# Patient Record
Sex: Female | Born: 1980 | Race: White | Hispanic: No | Marital: Married | Smoking: Never smoker
Health system: Southern US, Community
[De-identification: ages and names within clinical notes are randomized; demographics above are authoritative.]

## PROBLEM LIST (undated history)

## (undated) DIAGNOSIS — F429 Obsessive-compulsive disorder, unspecified: Secondary | ICD-10-CM

## (undated) DIAGNOSIS — F419 Anxiety disorder, unspecified: Secondary | ICD-10-CM

## (undated) DIAGNOSIS — K219 Gastro-esophageal reflux disease without esophagitis: Secondary | ICD-10-CM

## (undated) HISTORY — DX: Obsessive-compulsive disorder, unspecified: F42.9

## (undated) HISTORY — PX: WISDOM TOOTH EXTRACTION: SHX21

---

## 2003-06-25 ENCOUNTER — Encounter: Payer: Self-pay | Admitting: Family Medicine

## 2003-06-25 ENCOUNTER — Encounter: Admission: RE | Admit: 2003-06-25 | Discharge: 2003-06-25 | Payer: Self-pay | Admitting: Family Medicine

## 2012-06-09 ENCOUNTER — Other Ambulatory Visit: Payer: Self-pay | Admitting: Gastroenterology

## 2012-06-09 DIAGNOSIS — R109 Unspecified abdominal pain: Secondary | ICD-10-CM

## 2012-06-16 ENCOUNTER — Other Ambulatory Visit (HOSPITAL_COMMUNITY): Payer: Self-pay

## 2013-08-28 ENCOUNTER — Ambulatory Visit (INDEPENDENT_AMBULATORY_CARE_PROVIDER_SITE_OTHER): Payer: BC Managed Care – PPO | Admitting: Licensed Clinical Social Worker

## 2013-08-28 DIAGNOSIS — F411 Generalized anxiety disorder: Secondary | ICD-10-CM

## 2013-09-20 ENCOUNTER — Ambulatory Visit (INDEPENDENT_AMBULATORY_CARE_PROVIDER_SITE_OTHER): Payer: BC Managed Care – PPO | Admitting: Licensed Clinical Social Worker

## 2013-09-20 DIAGNOSIS — F411 Generalized anxiety disorder: Secondary | ICD-10-CM

## 2013-10-04 ENCOUNTER — Ambulatory Visit (INDEPENDENT_AMBULATORY_CARE_PROVIDER_SITE_OTHER): Payer: BC Managed Care – PPO | Admitting: Licensed Clinical Social Worker

## 2013-10-04 DIAGNOSIS — F411 Generalized anxiety disorder: Secondary | ICD-10-CM

## 2013-10-23 ENCOUNTER — Ambulatory Visit (INDEPENDENT_AMBULATORY_CARE_PROVIDER_SITE_OTHER): Payer: BC Managed Care – PPO | Admitting: Licensed Clinical Social Worker

## 2013-10-23 DIAGNOSIS — F411 Generalized anxiety disorder: Secondary | ICD-10-CM

## 2013-11-13 ENCOUNTER — Ambulatory Visit (INDEPENDENT_AMBULATORY_CARE_PROVIDER_SITE_OTHER): Payer: BC Managed Care – PPO | Admitting: Licensed Clinical Social Worker

## 2013-11-13 DIAGNOSIS — F411 Generalized anxiety disorder: Secondary | ICD-10-CM

## 2013-12-11 ENCOUNTER — Ambulatory Visit (INDEPENDENT_AMBULATORY_CARE_PROVIDER_SITE_OTHER): Payer: BC Managed Care – PPO | Admitting: Licensed Clinical Social Worker

## 2013-12-11 DIAGNOSIS — F411 Generalized anxiety disorder: Secondary | ICD-10-CM

## 2014-01-08 ENCOUNTER — Ambulatory Visit (INDEPENDENT_AMBULATORY_CARE_PROVIDER_SITE_OTHER): Payer: BC Managed Care – PPO | Admitting: Licensed Clinical Social Worker

## 2014-01-08 DIAGNOSIS — F411 Generalized anxiety disorder: Secondary | ICD-10-CM

## 2015-03-29 ENCOUNTER — Other Ambulatory Visit: Payer: Self-pay | Admitting: Occupational Medicine

## 2015-03-29 ENCOUNTER — Ambulatory Visit: Payer: Self-pay

## 2015-03-29 DIAGNOSIS — M79604 Pain in right leg: Secondary | ICD-10-CM

## 2016-02-13 ENCOUNTER — Ambulatory Visit (INDEPENDENT_AMBULATORY_CARE_PROVIDER_SITE_OTHER): Payer: BC Managed Care – PPO | Admitting: Podiatry

## 2016-02-13 ENCOUNTER — Encounter: Payer: Self-pay | Admitting: Podiatry

## 2016-02-13 ENCOUNTER — Ambulatory Visit (INDEPENDENT_AMBULATORY_CARE_PROVIDER_SITE_OTHER): Payer: BC Managed Care – PPO

## 2016-02-13 VITALS — BP 119/77 | HR 80 | Resp 16 | Ht 64.0 in | Wt 173.0 lb

## 2016-02-13 DIAGNOSIS — M779 Enthesopathy, unspecified: Secondary | ICD-10-CM

## 2016-02-13 DIAGNOSIS — M79672 Pain in left foot: Secondary | ICD-10-CM | POA: Diagnosis not present

## 2016-02-13 DIAGNOSIS — M79671 Pain in right foot: Secondary | ICD-10-CM

## 2016-02-13 NOTE — Progress Notes (Signed)
   Subjective:    Patient ID: Sandra Calhoun, female    DOB: Feb 12, 1981, 35 y.o.   MRN: [id number]  HPI Patient presents with bilateral foot pain; plantar forefoot. Pt stated, "when run, feet hurt; burning pain; more pain in right foot"; x2-3 yr.   Review of Systems  HENT: Positive for sinus pressure.   Neurological: Positive for dizziness.  All other systems reviewed and are negative.      Objective:   Physical Exam        Assessment & Plan:

## 2016-02-16 NOTE — Progress Notes (Signed)
Subjective:     Patient ID: Sandra Calhoun, female   DOB: 10-Jan-1981, 35 y.o.   MRN: [id number]  HPI patient states when she tries to be active or running that she gets pain in the forefoot right over left and it's been going on for several years. She's tried to modify her shoe gear she's tried trimming some callus formation and is not achieved relief of symptoms   Review of Systems  All other systems reviewed and are negative.      Objective:   Physical Exam  Constitutional: She is oriented to person, place, and time.  Cardiovascular: Intact distal pulses.   Musculoskeletal: Normal range of motion.  Neurological: She is oriented to person, place, and time.  Skin: Skin is warm.  Nursing note and vitals reviewed.  neurovascular status intact muscle strength adequate range of motion within normal limits with patient found to have discomfort in the forefoot right over left with inflammation and fluid around the metatarsal phalangeal joint second bilateral. There is mild equinus condition which is probably contributory to her problem and callus formation which does create increased pressure     Assessment:     Foot structural issues leading to chronic increase and inflammation pain around the lesser MPJs right over left    Plan:     H&P and x-rays reviewed with patient. At this point I have recommended long-term orthotics to disperse pressure off the joint surfaces and physical therapy and orthotics were scanned for today. We also discussed shoe gear modifications was stiffer bottom shoes  X-ray report indicates that there is no indications of stress fracture or other pathological process

## 2016-03-11 ENCOUNTER — Ambulatory Visit: Payer: BC Managed Care – PPO | Admitting: *Deleted

## 2016-03-11 DIAGNOSIS — M79672 Pain in left foot: Principal | ICD-10-CM

## 2016-03-11 DIAGNOSIS — M79671 Pain in right foot: Secondary | ICD-10-CM

## 2016-03-11 NOTE — Progress Notes (Signed)
Patient ID: Sandra Calhoun, female   DOB: [birth year], 35 y.o.   MRN: [id number] Patient presents for orthotic pick up.  Verbal and written break in and wear instructions given.  Patient will follow up in 4 weeks if symptoms worsen or fail to improve.

## 2016-03-11 NOTE — Patient Instructions (Signed)

## 2017-08-05 ENCOUNTER — Encounter (HOSPITAL_COMMUNITY): Payer: Self-pay | Admitting: Emergency Medicine

## 2017-08-05 ENCOUNTER — Emergency Department (HOSPITAL_COMMUNITY)
Admission: EM | Admit: 2017-08-05 | Discharge: 2017-08-06 | Disposition: A | Discharge status: Home / Self Care | Payer: BC Managed Care – PPO | Attending: Emergency Medicine | Admitting: Emergency Medicine

## 2017-08-05 DIAGNOSIS — R450 Nervousness: Secondary | ICD-10-CM | POA: Diagnosis not present

## 2017-08-05 DIAGNOSIS — Z79899 Other long term (current) drug therapy: Secondary | ICD-10-CM | POA: Diagnosis not present

## 2017-08-05 DIAGNOSIS — F411 Generalized anxiety disorder: Secondary | ICD-10-CM | POA: Diagnosis not present

## 2017-08-05 DIAGNOSIS — F429 Obsessive-compulsive disorder, unspecified: Secondary | ICD-10-CM | POA: Diagnosis present

## 2017-08-05 DIAGNOSIS — F419 Anxiety disorder, unspecified: Secondary | ICD-10-CM | POA: Diagnosis present

## 2017-08-05 HISTORY — DX: Anxiety disorder, unspecified: F41.9

## 2017-08-05 HISTORY — DX: Gastro-esophageal reflux disease without esophagitis: K21.9

## 2017-08-05 LAB — COMPREHENSIVE METABOLIC PANEL
ALBUMIN: 4.1 g/dL (ref 3.5–5.0)
ALT: 33 U/L (ref 14–54)
ANION GAP: 11 (ref 5–15)
AST: 24 U/L (ref 15–41)
Alkaline Phosphatase: 30 U/L — ABNORMAL LOW (ref 38–126)
BILIRUBIN TOTAL: 0.6 mg/dL (ref 0.3–1.2)
BUN: 13 mg/dL (ref 6–20)
CO2: 23 mmol/L (ref 22–32)
CREATININE: 0.7 mg/dL (ref 0.44–1.00)
Calcium: 9.1 mg/dL (ref 8.9–10.3)
Chloride: 103 mmol/L (ref 101–111)
GFR calc non Af Amer: >60 mL/min (ref >60)
GLUCOSE: 144 mg/dL — AB (ref 65–99)
Potassium: 3.7 mmol/L (ref 3.5–5.1)
SODIUM: 137 mmol/L (ref 135–145)
TOTAL PROTEIN: 7.4 g/dL (ref 6.5–8.1)

## 2017-08-05 LAB — RAPID URINE DRUG SCREEN, HOSP PERFORMED
Amphetamines: NOT DETECTED
Barbiturates: NOT DETECTED
Benzodiazepines: NOT DETECTED
Cocaine: NOT DETECTED
Opiates: NOT DETECTED
TETRAHYDROCANNABINOL: NOT DETECTED

## 2017-08-05 LAB — CBC
HEMATOCRIT: 39.6 % (ref 36.0–46.0)
HEMOGLOBIN: 13.8 g/dL (ref 12.0–15.0)
MCH: 31 pg (ref 26.0–34.0)
MCHC: 34.8 g/dL (ref 30.0–36.0)
MCV: 89 fL (ref 78.0–100.0)
Platelets: 240 10*3/uL (ref 150–400)
RBC: 4.45 MIL/uL (ref 3.87–5.11)
RDW: 11.7 % (ref 11.5–15.5)
WBC: 10 10*3/uL (ref 4.0–10.5)

## 2017-08-05 LAB — ACETAMINOPHEN LEVEL

## 2017-08-05 LAB — SALICYLATE LEVEL: Salicylate Lvl: <7 mg/dL (ref 2.8–30.0)

## 2017-08-05 LAB — HCG, QUANTITATIVE, PREGNANCY: hCG, Beta Chain, Quant, S: <1 m[IU]/mL (ref <5)

## 2017-08-05 LAB — TSH: TSH: 2.095 u[IU]/mL (ref 0.350–4.500)

## 2017-08-05 LAB — POC URINE PREG, ED: PREG TEST UR: NEGATIVE

## 2017-08-05 LAB — ETHANOL: Alcohol, Ethyl (B): <10 mg/dL (ref <10)

## 2017-08-05 MED ORDER — LORAZEPAM 1 MG PO TABS
1.0000 mg | ORAL_TABLET | Freq: Once | ORAL | Status: AC
  Administered 2017-08-05: 1 mg via ORAL
  Filled 2017-08-05: qty 1

## 2017-08-05 MED ORDER — LEVONORG-ETH ESTRAD TRIPHASIC PO TABS: 1.0000 | ORAL_TABLET | Freq: Every day | ORAL | Status: DC

## 2017-08-05 MED ORDER — DULOXETINE HCL 30 MG PO CPEP: 30.0000 mg | ORAL_CAPSULE | Freq: Every day | ORAL | Status: DC

## 2017-08-05 MED ORDER — CLONAZEPAM 0.5 MG PO TABS
0.5000 mg | ORAL_TABLET | Freq: Two times a day (BID) | ORAL | Status: DC | PRN
  Administered 2017-08-05: 0.5 mg via ORAL
  Filled 2017-08-05: qty 1

## 2017-08-05 MED ORDER — ASENAPINE MALEATE 5 MG SL SUBL
5.0000 mg | SUBLINGUAL_TABLET | Freq: Every day | SUBLINGUAL | Status: DC
  Administered 2017-08-05: 5 mg via SUBLINGUAL
  Filled 2017-08-05: qty 1

## 2017-08-05 MED ORDER — PANTOPRAZOLE SODIUM 40 MG PO TBEC
40.0000 mg | DELAYED_RELEASE_TABLET | Freq: Every day | ORAL | Status: DC
  Administered 2017-08-06: 40 mg via ORAL
  Filled 2017-08-05: qty 1

## 2017-08-05 NOTE — ED Notes (Signed)
Pt came to [facility] reporting decrease sleep and appetite. Pt says that she obsesses and has racing thoughts. Pt said that she was taking seroquel, had a panic attack and thought that it was not working well. Pt's doctor changed her medication to cymbalta. She saw a commercial about the medication and thought that she was having the symptoms. Pt reports passive si thoughts "I don't think that I would ever do it." When asked about HI thoughts . She responded "I worry, could I do it?"

## 2017-08-05 NOTE — ED Notes (Signed)
Bed: [facility] Expected date:  Expected time:  Means of arrival:  Comments: 28 

## 2017-08-05 NOTE — ED Notes (Signed)
Vernona Rieger from main lab verbalizes will add hcg to labs already collected/in lab.

## 2017-08-05 NOTE — BHH Counselor (Signed)
Collateral:   Karleen Hampshire, PA, with [facility] called TTS reporting she referring the patient to be assessed. Report patient medication was changed Tuesday. Report since medication change patient has not been eating, sleeping, and having suicidal thoughts with a plan to cut self.

## 2017-08-05 NOTE — ED Notes (Signed)
Pt pleasant on approach. Voicing no complaints at this time. Pt not endorsing SI. Encouragement and support provided. Special checks q 15 mins in place for safety, Video monitoring in place. Will continue to monitor.

## 2017-08-05 NOTE — ED Notes (Signed)
Visitor at bedside.

## 2017-08-05 NOTE — BH Assessment (Signed)
Assessment Note  Sandra Calhoun is an 36 y.o. female presenting to [facility] voluntarily for a psychiatric evaluation. She was transported to [facility] by her mother. Referred by Karleen Hampshire, PA.  Patient presents with passive suicidal ideations starting last night. She does not have a history of suicidal thoughts, gestures, and/or attempts. Denies self mutilating behaviors. She believes that her suicidal ideations are the result of a recent medication change. She was taking Seroquel and her doctor changed her medication to Cymbalta 2-3 days ago. Since the medication change patient has no only started having suicidal thoughts but also reports a increase in anxiety. She is married and her spouse is out of town. She has decided to stay with her mother for the past 2 days for comfort. She also reports lack of sleep and appetite for 2 days. She reports 7 pounds of weight loss in past several days. She denies HI. Calm and cooperative. No AVH's. Patient does no appear to be responding to internal stimuli. Denies drug use. She drinks a glass of wine occasionally. She has no history of Inpatient psychiatric treatment.   Diagnosis: Major Depressive Disorder, Single Episode, Severe, without psychotic features  Past Medical History:  Past Medical History:  Diagnosis Date  . Anxiety   . GERD (gastroesophageal reflux disease)     Past Surgical History:  Procedure Laterality Date  . WISDOM TOOTH EXTRACTION      Family History:  Family History  Problem Relation Age of Onset  . Cancer Mother   . Diabetes Father   . Hypertension Father   . Hyperlipidemia Father     Social History:  reports that she has never smoked. She does not have any smokeless tobacco history on file. She reports that she drinks alcohol. She reports that she does not use drugs.  Additional Social History:  Alcohol / Drug Use Pain Medications: SEE MAR Prescriptions: SEE MAR Over the Counter: SEE MAR History of alcohol / drug use?: No history of  alcohol / drug abuse  CIWA: CIWA-Ar BP: 134/77 Pulse Rate: 79 COWS:    Allergies: No Known Allergies  Home Medications:  (Not in a hospital admission)  OB/GYN Status:  Patient's last menstrual period was 07/23/2017 (exact date).  General Assessment Data Location of Assessment: [facility] ED TTS Assessment: In system Is this a Tele or Face-to-Face Assessment?: Face-to-Face Is this an Initial Assessment or a Re-assessment for this encounter?: Initial Assessment Marital status: Married [place] name:  (unk) Is patient pregnant?: No Pregnancy Status: No Living Arrangements: Spouse/significant other Can pt return to current living arrangement?: No Admission Status: Voluntary Is patient capable of signing voluntary admission?: Yes Referral Source: Self/Family/Friend Insurance type:  Herbalist)     Crisis Care Plan Living Arrangements: Spouse/significant other Legal Guardian: Other: (no legal guardian ) Name of Psychiatrist:  (no psychiatrist ) Name of Therapist:  (no therapist )  Education Status Is patient currently in school?: No Current Grade:  (n/a) Highest grade of school patient has completed:  (Masters Degree) Name of school:  (n/a) Contact person:  (n/a)  Risk to self with the past 6 months Suicidal Ideation: Yes-Currently Present Has patient been a risk to self within the past 6 months prior to admission? : Yes Suicidal Intent: No Has patient had any suicidal intent within the past 6 months prior to admission? : No Is patient at risk for suicide?: No Suicidal Plan?: No Has patient had any suicidal plan within the past 6 months prior to admission? : No Access to  Means: No What has been your use of drugs/alcohol within the last 12 months?:  (denies ) Previous Attempts/Gestures: No How many times?:  (0) Other Self Harm Risks:  (denies self harm risks) Triggers for Past Attempts: Other (Comment) (no triggers for past attempts or gestures) Intentional Self Injurious  Behavior: None Family Suicide History: No Recent stressful life event(s): Other (Comment) (recent medication change) Persecutory voices/beliefs?: No Depression: Yes Depression Symptoms: Loss of interest in usual pleasures, Feeling worthless/self pity, Feeling angry/irritable, Fatigue, Isolating, Tearfulness Substance abuse history and/or treatment for substance abuse?: No Suicide prevention information given to non-admitted patients: Not applicable  Risk to Others within the past 6 months Homicidal Ideation: No Does patient have any lifetime risk of violence toward others beyond the six months prior to admission? : No Thoughts of Harm to Others: No Current Homicidal Intent: No Current Homicidal Plan: No Access to Homicidal Means: No Identified Victim:  (n/a) History of harm to others?: No Assessment of Violence: None Noted Violent Behavior Description:  (patient is calm and cooperative ) Does patient have access to weapons?: No Criminal Charges Pending?: No Does patient have a court date: No Is patient on probation?: No  Psychosis Hallucinations: None noted Delusions: None noted  Mental Status Report Appearance/Hygiene: Disheveled Eye Contact: Good Motor Activity: Freedom of movement Speech: Logical/coherent Level of Consciousness: Alert Mood: Depressed Affect: Appropriate to circumstance Anxiety Level: None Thought Processes: Relevant, Coherent Judgement: Impaired Orientation: Person, Place, Time, Situation Obsessive Compulsive Thoughts/Behaviors: None  Cognitive Functioning Concentration: Decreased Memory: Recent Intact, Remote Intact IQ: Average Insight: Fair Impulse Control: Poor Appetite: Poor Weight Loss:  (no food in 2 days; loss 7 pounds in the past several days ) Weight Gain:  (denies ) Sleep: Decreased Total Hours of Sleep:  (varies ) Vegetative Symptoms: None  ADLScreening [facility] Assessment Services) Patient's cognitive ability adequate to safely  complete daily activities?: Yes Patient able to express need for assistance with ADLs?: Yes Independently performs ADLs?: Yes (appropriate for developmental age)  Prior Inpatient Therapy Prior Inpatient Therapy: No Prior Therapy Dates:  (n/a) Prior Therapy Facilty/Provider(s):  (n/a) Reason for Treatment:  (n/a)  Prior Outpatient Therapy Prior Outpatient Therapy: No Prior Therapy Dates:  (n/a) Prior Therapy Facilty/Provider(s):  (n/a) Reason for Treatment:  (n/a) Does patient have an ACCT team?: No Does patient have Intensive In-House Services?  : No Does patient have [facility] services? : No Does patient have P4CC services?: No  ADL Screening (condition at time of admission) Patient's cognitive ability adequate to safely complete daily activities?: Yes Is the patient deaf or have difficulty hearing?: No Does the patient have difficulty seeing, even when wearing glasses/contacts?: No Does the patient have difficulty concentrating, remembering, or making decisions?: No Patient able to express need for assistance with ADLs?: Yes Does the patient have difficulty dressing or bathing?: No Independently performs ADLs?: Yes (appropriate for developmental age) Does the patient have difficulty walking or climbing stairs?: No Weakness of Legs: None Weakness of Arms/Hands: None  Home Assistive Devices/Equipment Home Assistive Devices/Equipment: None    Abuse/Neglect Assessment (Assessment to be complete while patient is alone) Physical Abuse: Denies Verbal Abuse: Denies Sexual Abuse: Denies Exploitation of patient/patient's resources: Denies Self-Neglect: Denies Values / Beliefs Cultural Requests During Hospitalization: None Spiritual Requests During Hospitalization: None   Advance Directives (For Healthcare) Does Patient Have a Medical Advance Directive?: No Would patient like information on creating a medical advance directive?: No - Patient declined Nutrition Screen- [facility]  Adult/[facility] Patient's home diet: Regular  Additional  Information 1:1 In Past 12 Months?: No CIRT Risk: No Elopement Risk: No Does patient have medical clearance?: Yes     Disposition:  Disposition Initial Assessment Completed for this Encounter: Yes (Patient remain in the ED for formulated safety plan) Disposition of Patient: Other dispositions Other disposition(s): Other (Comment) (Patient remain in the ED for formulated safety plan)  On Site Evaluation by:   Reviewed with Physician:    Melynda Ripple 08/05/2017 4:20 PM

## 2017-08-05 NOTE — ED Triage Notes (Signed)
Pt c/o inability to eat or sleep x 2 days. Pt was started on Cymbalta 2 days ago.Pt was seen by PCP this am and referred for possible evaluation at [facility]. She stated that she was having suicidal thoughts last night. Denies thoughts or plan today. Last dosage of new medication was taken last night. Took 2 dosages of this medication since prescribed. Pt is alert, oriented and appropriate. Mother at bedside.

## 2017-08-05 NOTE — Progress Notes (Signed)
Pt brought back to TCU.  Pt changed and belongings put on locker. Security wanded and cleared pt.Marland Kitchen

## 2017-08-05 NOTE — ED Provider Notes (Signed)
[facility]-EMERGENCY DEPT Provider Note   CSN: [id number] Arrival date & time: 08/05/17  1308     History   Chief Complaint Chief Complaint  Patient presents with  . Medical Clearance    HPI Sandra Calhoun is a 36 y.o. female.  HPI Patient is brought in with severe anxiety and some suicidal thoughts developing. Has had a general anxiety disorder that is been rather poorly controlled recently. States she is having anxiousness at several different things. States she was at a hotel smelled musty and she was so anxious that she had come home. Been seen by her primary care doctor and had her Seroquel switched over to Cymbalta. That was done around 2 days ago and has had 2 doses of the Cymbalta. She continues to worsen. Hardly slept last night due to anxiety. She's been doing dose to her primary care doctor. Denies substance abuse. She states she's lost about 7 pounds in last couple days because she has been not eating. Does have some chest tightness at times with the episodes. Some tightness in her hands. No localizing numbness or weakness. No headaches. No confusion. No hallucinations. She began to have some suicidal thoughts. More thoughts about doing that and the anxiety about potentially doing it. Past Medical History:  Diagnosis Date  . Anxiety   . GERD (gastroesophageal reflux disease)     There are no active problems to display for this patient.   Past Surgical History:  Procedure Laterality Date  . WISDOM TOOTH EXTRACTION      OB History    No data available       Home Medications    Prior to Admission medications   Medication Sig Start Date End Date Taking? Authorizing Provider  acetaminophen (TYLENOL) 325 MG tablet Take 650 mg by mouth every 6 (six) hours as needed for headache.   Yes [provider]  cetirizine (ZYRTEC) 10 MG tablet Take 10 mg by mouth daily.    Yes [provider]  clonazePAM (KLONOPIN) 0.5 MG tablet Take 0.5 mg by mouth 2 (two) times  daily as needed for anxiety.    Yes [provider]  DULoxetine (CYMBALTA) 30 MG capsule Take 30 mg by mouth daily. 08/03/17  Yes [provider]  fluticasone (FLONASE) 50 MCG/ACT nasal spray 1-2 sprays in each nostril daily as needed for allergies 02/01/16  Yes [provider]  levonorgestrel-ethinyl estradiol (ENPRESSE,TRIVORA) tablet Take 1 tablet by mouth daily. 07/22/17  Yes [provider]  naproxen sodium (ANAPROX) 220 MG tablet Take 220 mg by mouth daily as needed (HA).   Yes [provider]  pantoprazole (PROTONIX) 40 MG tablet Take 40 mg by mouth daily. 08/03/17  Yes [provider]    Family History Family History  Problem Relation Age of Onset  . Cancer Mother   . Diabetes Father   . Hypertension Father   . Hyperlipidemia Father     Social History Social History  Substance Use Topics  . Smoking status: Never Smoker  . Smokeless tobacco: Not on file  . Alcohol use Yes     Comment: occ     Allergies   Patient has no known allergies.   Review of Systems Review of Systems  Constitutional: Positive for appetite change.  HENT: Negative for congestion.   Respiratory: Negative for shortness of breath.   Cardiovascular: Negative for chest pain.  Gastrointestinal: Negative for abdominal pain.  Genitourinary: Negative for dysuria and frequency.  Musculoskeletal: Negative for back pain.  Neurological: Negative for seizures.  Hematological: Negative for adenopathy.  Psychiatric/Behavioral: The patient is nervous/anxious.      Physical Exam Updated Vital Signs BP 134/77 (BP Location: Right Arm)   Pulse 79   Temp 98.2 F (36.8 C) (Oral)   Resp 18   Wt 78.9 kg (174 lb)   LMP 07/23/2017 (Exact Date)   SpO2 100%   BMI 29.87 kg/m   Physical Exam  Constitutional: She appears well-developed.  HENT:  Head: Atraumatic.  Neck: Neck supple.  Cardiovascular: Normal rate.   Pulmonary/Chest: Effort normal.  Abdominal:  Soft. There is no tenderness.  Musculoskeletal: She exhibits no edema.  Neurological: She is alert.  Skin: Skin is warm. Capillary refill takes less than 2 seconds.  Psychiatric:  Patient appears very anxious     ED Treatments / Results  Labs (all labs ordered are listed, but only abnormal results are displayed) Labs Reviewed  COMPREHENSIVE METABOLIC PANEL - Abnormal; Notable for the following:       Result Value   Glucose, Bld 144 (*)    Alkaline Phosphatase 30 (*)    All other components within normal limits  ACETAMINOPHEN LEVEL - Abnormal; Notable for the following:    Acetaminophen (Tylenol), Serum <10 (*)    All other components within normal limits  ETHANOL  SALICYLATE LEVEL  CBC  RAPID URINE DRUG SCREEN, HOSP PERFORMED  TSH  POC URINE PREG, ED  I-STAT BETA HCG BLOOD, ED ([facility], [facility], [facility] ONLY)    EKG  EKG Interpretation None       Radiology No results found.  Procedures Procedures (including critical care time)  Medications Ordered in ED Medications  LORazepam (ATIVAN) tablet 1 mg (1 mg Oral Given 08/05/17 1135)     Initial Impression / Assessment and Plan / ED Course  I have reviewed the triage vital signs and the nursing notes.  Pertinent labs & imaging results that were available during my care of the patient were reviewed by me and considered in my medical decision making (see chart for details).     Patient with anxiety. Developing some suicidal thoughts. Medically cleared. Voluntary, to be seen by tts.   Final Clinical Impressions(s) / ED Diagnoses   Final diagnoses:  Generalized anxiety disorder    New Prescriptions New Prescriptions   No medications on file     Benjiman Core, MD 08/05/17 1506

## 2017-08-06 DIAGNOSIS — R450 Nervousness: Secondary | ICD-10-CM

## 2017-08-06 DIAGNOSIS — F411 Generalized anxiety disorder: Secondary | ICD-10-CM | POA: Diagnosis not present

## 2017-08-06 DIAGNOSIS — F429 Obsessive-compulsive disorder, unspecified: Secondary | ICD-10-CM | POA: Diagnosis present

## 2017-08-06 MED ORDER — CITALOPRAM HYDROBROMIDE 10 MG PO TABS
10.0000 mg | ORAL_TABLET | Freq: Every day | ORAL | 0 refills | Status: DC
Start: 2017-08-07 — End: 2023-04-26

## 2017-08-06 MED ORDER — CLONAZEPAM 0.5 MG PO TABS: 0.5000 mg | ORAL_TABLET | Freq: Every day | ORAL | Status: DC

## 2017-08-06 MED ORDER — CITALOPRAM HYDROBROMIDE 10 MG PO TABS
10.0000 mg | ORAL_TABLET | Freq: Every day | ORAL | Status: DC
  Administered 2017-08-06: 10 mg via ORAL
  Filled 2017-08-06: qty 1

## 2017-08-06 MED ORDER — CLONAZEPAM 0.5 MG PO TABS: 0.5000 mg | ORAL_TABLET | Freq: Every day | ORAL | 0 refills | Status: AC

## 2017-08-06 NOTE — BHH Suicide Risk Assessment (Signed)
Suicide Risk Assessment  Discharge Assessment   [facility] Discharge Suicide Risk Assessment   Principal Problem: OCD (obsessive compulsive disorder) Discharge Diagnoses:  Patient Active Problem List   Diagnosis Date Noted  . OCD (obsessive compulsive disorder) [F42.9] 08/06/2017    Priority: High  . GAD (generalized anxiety disorder) [F41.1] 08/06/2017    Priority: High    Total Time spent with patient: 45 minutes  Musculoskeletal: Strength & Muscle Tone: within normal limits Gait & Station: normal Patient leans: N/A  Psychiatric Specialty Exam: Physical Exam  Constitutional: She is oriented to person, place, and time. She appears well-developed and well-nourished.  HENT:  Head: Normocephalic.  Neck: Normal range of motion.  Respiratory: Effort normal.  Musculoskeletal: Normal range of motion.  Neurological: She is alert and oriented to person, place, and time.  Psychiatric: Her speech is normal and behavior is normal. Judgment and thought content normal. Her mood appears anxious. Cognition and memory are normal.    Review of Systems  Psychiatric/Behavioral: The patient is nervous/anxious.   All other systems reviewed and are negative.   Blood pressure 125/64, pulse (!) 101, temperature 98.9 F (37.2 C), temperature source Oral, resp. rate 18, weight 78.9 kg (174 lb), last menstrual period 07/23/2017, SpO2 99 %.Body mass index is 29.87 kg/m.  General Appearance: Casual  Eye Contact:  Good  Speech:  Normal Rate  Volume:  Normal  Mood:  Anxious  Affect:  Congruent  Thought Process:  Coherent and Descriptions of Associations: Intact  Orientation:  Full (Time, Place, and Person)  Thought Content:  WDL and Logical  Suicidal Thoughts:  No  Homicidal Thoughts:  No  Memory:  Immediate;   Good Recent;   Good Remote;   Good  Judgement:  Good  Insight:  Good  Psychomotor Activity:  Normal  Concentration:  Concentration: Good and Attention Span: Good  Recall:  Good  Fund of  Knowledge:  Good  Language:  Good  Akathisia:  No  Handed:  Right  AIMS (if indicated):     Assets:  Communication Skills Desire for Improvement Financial Resources/Insurance Housing Intimacy Leisure Time Physical Health Resilience Social Support Talents/Skills Transportation Vocational/Educational  ADL's:  Intact  Cognition:  WNL  Sleep:      Mental Status Per Nursing Assessment::   On Admission:   anxiety with suicidal ideations  Demographic Factors:  Caucasian  Loss Factors: NA  Historical Factors: NA  Risk Reduction Factors:   Sense of responsibility to family, Living with another person, especially a relative and Positive social support  Continued Clinical Symptoms:  Anxiety, mild  Cognitive Features That Contribute To Risk:  None    Suicide Risk:  Minimal: No identifiable suicidal ideation.  Patients presenting with no risk factors but with morbid ruminations; may be classified as minimal risk based on the severity of the depressive symptoms    Plan Of Care/Follow-up recommendations:  Activity:  as tolerated Diet:  heart healthy diet  LORD, JAMISON, NP 08/06/2017, 11:39 AM

## 2017-08-06 NOTE — ED Notes (Signed)
Pt discharged home. Discharged instructions read to pt who verbalized understanding. All belongings returned to pt who signed for same. Denies SI/HI, is not delusional and not responding to internal stimuli. Escorted pt to the ED exit.    

## 2017-08-06 NOTE — Discharge Instructions (Signed)
For your behavioral health needs, you are advised to follow up with the [facility].  You are scheduled for an intake appointment on Friday, August 27, 2017 at 11:45 am.  Since this is your first visit, plan to be there 30 minutes early to fill out intake paperwork:       [facility]      [address], [address]      [place], [place] [address]      [phone]

## 2017-08-06 NOTE — BH Assessment (Addendum)
BHH Assessment Progress Note  Per Thedore Mins, MD, this pt does not require psychiatric hospitalization at this time.  Pt is to be discharged from [facility] with an outpatient psychiatry appointment.  Pt agrees to this.  This Clinical research associate called the [facility] at 11:39 and spoke to [place].  Pt is scheduled for an intake appointment with [facility] on Friday, 08/27/2017 at 11:45.  This has been included in pt's discharge instructions.  Pt's nurse, Diane, has been notified.  Doylene Canning, MA Triage Specialist [phone]   Addendum:  At [facility] request, this writer faxed pt's discharge instructions to the [facility].  Doylene Canning, MA Triage Specialist [phone]

## 2017-08-06 NOTE — Consult Note (Addendum)
[facility] Consult   Reason for Consult:  Anxiety/panic attack after medication change Referring Physician:  EDP Patient Identification: Sandra Calhoun MRN:  [id number] Principal Diagnosis: OCD (obsessive compulsive disorder) Diagnosis:   Patient Active Problem List   Diagnosis Date Noted  . OCD (obsessive compulsive disorder) [F42.9] 08/06/2017    Priority: High  . GAD (generalized anxiety disorder) [F41.1] 08/06/2017    Priority: High    Total Time spent with patient: 45 minutes  Subjective:   Sandra Calhoun is a 36 y.o. female patient does not warrant admission.  HPI:  36 yo female who presented to the ED with an increase in depression and decrease in appetite and sleep.  SHe had been on Seroquel for anxiety and it was working until this week.  When she called her PCP, they discontinued the Seroquel and started Cymbalta which appears to have made her symptoms worse and was sent to the ED.  THese medications were cancelled and Saphris 5 mg given at bedtime.  She slept and her anxiety improved and her other symptoms dissipated.  Sandra Calhoun suffers from [place] and became concerned when she read that Cymbalta can facilitate suicidal ideations.  She then began to obsess about suicidal ideations despite not having any intention or past history but could not stop her thought process.  Today, she denies suicidal/homicidal ideations, hallucinations, or substance abuse.  She would like medications to assist her anxiety, these were discussed and decided to start Celexa 10 mg in the am and Klonopin 0.5 mg at bedtime for sleep and anxiety.  Stable to discharge, lives with her husband.  Outpatient resources provided for therapy and psychiatry.  Past Psychiatric History: anxiety, GAD, OCD  NOne Risk to Others: Homicidal Ideation: No Thoughts of Harm to Others: No Current Homicidal Intent: No Current Homicidal Plan: No Access to Homicidal Means: No Identified Victim:  (n/a) History of  harm to others?: No Assessment of Violence: None Noted Violent Behavior Description:  (patient is calm and cooperative ) Does patient have access to weapons?: No Criminal Charges Pending?: No Does patient have a court date: No Prior Inpatient Therapy: Prior Inpatient Therapy: No Prior Therapy Dates:  (n/a) Prior Therapy Facilty/Provider(s):  (n/a) Reason for Treatment:  (n/a) Prior Outpatient Therapy: Prior Outpatient Therapy: No Prior Therapy Dates:  (n/a) Prior Therapy Facilty/Provider(s):  (n/a) Reason for Treatment:  (n/a) Does patient have an ACCT team?: No Does patient have Intensive In-House Services?  : No Does patient have [facility] services? : No Does patient have [facility] services?: No  Past Medical History:  Past Medical History:  Diagnosis Date  . Anxiety   . GERD (gastroesophageal reflux disease)     Past Surgical History:  Procedure Laterality Date  . WISDOM TOOTH EXTRACTION     Family History:  Family History  Problem Relation Age of Onset  . Cancer Mother   . Diabetes Father   . Hypertension Father   . Hyperlipidemia Father    Family Psychiatric  History: anxiety, depression Social History:  History  Alcohol Use  . Yes    Comment: occ     History  Drug Use No    Social History   Social History  . Marital status: Unknown    Spouse name: N/A  . Number of children: N/A  . Years of education: N/A   Social History Main Topics  . Smoking status: Never Smoker  . Smokeless tobacco: None  . Alcohol use Yes     Comment: occ  .  Drug use: No  . Sexual activity: Yes    Birth control/ protection: Pill   Other Topics Concern  . None   Social History Narrative  . None   Additional Social History:    Allergies:  No Known Allergies  Labs:  Results for orders placed or performed during the hospital encounter of 08/05/17 (from the past 48 hour(s))  Rapid urine drug screen (hospital performed)     Status: None   Collection Time: 08/05/17 10:40 AM   Result Value Ref Range   Opiates NONE DETECTED NONE DETECTED   Cocaine NONE DETECTED NONE DETECTED   Benzodiazepines NONE DETECTED NONE DETECTED   Amphetamines NONE DETECTED NONE DETECTED   Tetrahydrocannabinol NONE DETECTED NONE DETECTED   Barbiturates NONE DETECTED NONE DETECTED    Comment:        DRUG SCREEN FOR MEDICAL PURPOSES ONLY.  IF CONFIRMATION IS NEEDED FOR ANY PURPOSE, NOTIFY LAB WITHIN 5 DAYS.        LOWEST DETECTABLE LIMITS FOR URINE DRUG SCREEN Drug Class       Cutoff (ng/mL) Amphetamine      1000 Barbiturate      200 Benzodiazepine   295 Tricyclics       621 Opiates          300 Cocaine          300 THC              50   POC urine preg, ED     Status: None   Collection Time: 08/05/17 10:52 AM  Result Value Ref Range   Preg Test, Ur NEGATIVE NEGATIVE    Comment:        THE SENSITIVITY OF THIS METHODOLOGY IS >24 mIU/mL   Comprehensive metabolic panel     Status: Abnormal   Collection Time: 08/05/17  1:14 PM  Result Value Ref Range   Sodium 137 135 - 145 mmol/L   Potassium 3.7 3.5 - 5.1 mmol/L   Chloride 103 101 - 111 mmol/L   CO2 23 22 - 32 mmol/L   Glucose, Bld 144 (H) 65 - 99 mg/dL   BUN 13 6 - 20 mg/dL   Creatinine, Ser 0.70 0.44 - 1.00 mg/dL   Calcium 9.1 8.9 - 10.3 mg/dL   Total Protein 7.4 6.5 - 8.1 g/dL   Albumin 4.1 3.5 - 5.0 g/dL   AST 24 15 - 41 U/L   ALT 33 14 - 54 U/L   Alkaline Phosphatase 30 (L) 38 - 126 U/L   Total Bilirubin 0.6 0.3 - 1.2 mg/dL   GFR calc non Af Amer >60 >60 mL/min   GFR calc Af Amer >60 >60 mL/min    Comment: (NOTE) The eGFR has been calculated using the CKD EPI equation. This calculation has not been validated in all clinical situations. eGFR's persistently <60 mL/min signify possible Chronic Kidney Disease.    Anion gap 11 5 - 15  Ethanol     Status: None   Collection Time: 08/05/17  1:14 PM  Result Value Ref Range   Alcohol, Ethyl (B) <10 <10 mg/dL    Comment:        LOWEST DETECTABLE LIMIT FOR SERUM  ALCOHOL IS 10 mg/dL FOR MEDICAL PURPOSES ONLY Please note change in reference range.   Salicylate level     Status: None   Collection Time: 08/05/17  1:14 PM  Result Value Ref Range   Salicylate Lvl <3.0 2.8 - 30.0 mg/dL  Acetaminophen level  Status: Abnormal   Collection Time: 08/05/17  1:14 PM  Result Value Ref Range   Acetaminophen (Tylenol), Serum <10 (L) 10 - 30 ug/mL    Comment:        THERAPEUTIC CONCENTRATIONS VARY SIGNIFICANTLY. A RANGE OF 10-30 ug/mL MAY BE AN EFFECTIVE CONCENTRATION FOR MANY PATIENTS. HOWEVER, SOME ARE BEST TREATED AT CONCENTRATIONS OUTSIDE THIS RANGE. ACETAMINOPHEN CONCENTRATIONS >150 ug/mL AT 4 HOURS AFTER INGESTION AND >50 ug/mL AT 12 HOURS AFTER INGESTION ARE OFTEN ASSOCIATED WITH TOXIC REACTIONS.   cbc     Status: None   Collection Time: 08/05/17  1:14 PM  Result Value Ref Range   WBC 10.0 4.0 - 10.5 K/uL   RBC 4.45 3.87 - 5.11 MIL/uL   Hemoglobin 13.8 12.0 - 15.0 g/dL   HCT 39.6 36.0 - 46.0 %   MCV 89.0 78.0 - 100.0 fL   MCH 31.0 26.0 - 34.0 pg   MCHC 34.8 30.0 - 36.0 g/dL   RDW 11.7 11.5 - 15.5 %   Platelets 240 150 - 400 K/uL  TSH     Status: None   Collection Time: 08/05/17  1:14 PM  Result Value Ref Range   TSH 2.095 0.350 - 4.500 uIU/mL    Comment: Performed by a 3rd Generation assay with a functional sensitivity of <=0.01 uIU/mL.  hCG, quantitative, pregnancy     Status: None   Collection Time: 08/05/17  1:14 PM  Result Value Ref Range   hCG, Beta Chain, Quant, S <1 <5 mIU/mL    Comment:          GEST. AGE      CONC.  (mIU/mL)   <=1 WEEK        5 - 50     2 WEEKS       50 - 500     3 WEEKS       100 - 10,000     4 WEEKS     1,000 - 30,000     5 WEEKS     3,500 - 115,000   6-8 WEEKS     12,000 - 270,000    12 WEEKS     15,000 - 220,000        FEMALE AND NON-PREGNANT FEMALE:     LESS THAN 5 mIU/mL     Current Facility-Administered Medications  Medication Dose Route Frequency Provider Last Rate Last Dose  .  citalopram (CELEXA) tablet 10 mg  10 mg Oral Daily Talvin Christianson, MD   10 mg at 08/06/17 1057  . clonazePAM (KLONOPIN) tablet 0.5 mg  0.5 mg Oral QHS Leilynn Pilat, MD      . levonorgestrel-ethinyl estradiol (ENPRESSE,TRIVORA) per tablet 1 tablet  1 tablet Oral Daily Davonna Belling, MD      . pantoprazole (PROTONIX) EC tablet 40 mg  40 mg Oral Daily Davonna Belling, MD   40 mg at 08/06/17 1057   Current Outpatient Prescriptions  Medication Sig Dispense Refill  . acetaminophen (TYLENOL) 325 MG tablet Take 650 mg by mouth every 6 (six) hours as needed for headache.    . cetirizine (ZYRTEC) 10 MG tablet Take 10 mg by mouth daily.     . clonazePAM (KLONOPIN) 0.5 MG tablet Take 0.5 mg by mouth 2 (two) times daily as needed for anxiety.     . DULoxetine (CYMBALTA) 30 MG capsule Take 30 mg by mouth daily.  2  . fluticasone (FLONASE) 50 MCG/ACT nasal spray 1-2 sprays in each nostril daily as needed  for allergies  5  . levonorgestrel-ethinyl estradiol (ENPRESSE,TRIVORA) tablet Take 1 tablet by mouth daily.  11  . naproxen sodium (ANAPROX) 220 MG tablet Take 220 mg by mouth daily as needed (HA).    . pantoprazole (PROTONIX) 40 MG tablet Take 40 mg by mouth daily.  2    Musculoskeletal: Strength & Muscle Tone: within normal limits Gait & Station: normal Patient leans: N/A  Psychiatric Specialty Exam: Physical Exam  Constitutional: She is oriented to person, place, and time. She appears well-developed and well-nourished.  HENT:  Head: Normocephalic.  Neck: Normal range of motion.  Respiratory: Effort normal.  Musculoskeletal: Normal range of motion.  Neurological: She is alert and oriented to person, place, and time.  Psychiatric: Her speech is normal and behavior is normal. Judgment and thought content normal. Her mood appears anxious. Cognition and memory are normal.    Review of Systems  Psychiatric/Behavioral: The patient is nervous/anxious.   All other systems reviewed and are  negative.   Blood pressure 125/64, pulse (!) 101, temperature 98.9 F (37.2 C), temperature source Oral, resp. rate 18, weight 78.9 kg (174 lb), last menstrual period 07/23/2017, SpO2 99 %.Body mass index is 29.87 kg/m.  General Appearance: Casual  Eye Contact:  Good  Speech:  Normal Rate  Volume:  Normal  Mood:  Anxious  Affect:  Congruent  Thought Process:  Coherent and Descriptions of Associations: Intact  Orientation:  Full (Time, Place, and Person)  Thought Content:  WDL and Logical  Suicidal Thoughts:  No  Homicidal Thoughts:  No  Memory:  Immediate;   Good Recent;   Good Remote;   Good  Judgement:  Good  Insight:  Good  Psychomotor Activity:  Normal  Concentration:  Concentration: Good and Attention Span: Good  Recall:  Good  Fund of Knowledge:  Good  Language:  Good  Akathisia:  No  Handed:  Right  AIMS (if indicated):     Assets:  Communication Skills Desire for Improvement Financial Resources/Insurance Housing Intimacy Leisure Time Physical Health Resilience Social Support Talents/Skills Transportation Vocational/Educational  ADL's:  Intact  Cognition:  WNL  Sleep:        Treatment Plan Summary: Daily contact with patient to assess and evaluate symptoms and progress in treatment, Medication management and Plan obsessive compulsive disorder, unspecified type:  -Crisis stabilization -Medication management:  Started Celexa 10 mg in the am for anxiety and Klonopin 0.5 mg at bedtime for sleep and anxiety -Individual counseling -Outpatient resources  Disposition: No evidence of imminent risk to self or others at present.    Waylan Boga, NP 08/06/2017 11:26 AM  Patient seen face-to-face for psychiatric evaluation, chart reviewed and case discussed with the physician extender and developed treatment plan. Reviewed the information documented and agree with the treatment plan. Corena Pilgrim, MD

## 2019-11-27 ENCOUNTER — Other Ambulatory Visit: Payer: Self-pay | Admitting: Obstetrics and Gynecology

## 2019-11-27 DIAGNOSIS — N6310 Unspecified lump in the right breast, unspecified quadrant: Secondary | ICD-10-CM

## 2019-12-07 ENCOUNTER — Ambulatory Visit
Admission: RE | Admit: 2019-12-07 | Discharge: 2019-12-07 | Disposition: A | Discharge status: Home / Self Care | Payer: BC Managed Care – PPO | Source: Ambulatory Visit | Attending: Obstetrics and Gynecology | Admitting: Obstetrics and Gynecology

## 2019-12-07 ENCOUNTER — Other Ambulatory Visit: Payer: Self-pay

## 2019-12-07 DIAGNOSIS — N6310 Unspecified lump in the right breast, unspecified quadrant: Secondary | ICD-10-CM

## 2020-01-07 ENCOUNTER — Ambulatory Visit: Payer: BC Managed Care – PPO | Attending: Internal Medicine

## 2020-01-07 DIAGNOSIS — Z23 Encounter for immunization: Secondary | ICD-10-CM | POA: Insufficient documentation

## 2020-01-07 NOTE — Progress Notes (Signed)
   Covid-19 Vaccination Clinic  Name:  Sandra Calhoun    MRN: [id number] DOB: [birth year]  01/07/2020  [name] was observed post Covid-19 immunization for 15 minutes without incident. She was provided with Vaccine Information Sheet and instruction to access the V-Safe system.   [name] was instructed to call 911 with any severe reactions post vaccine: Marland Kitchen Difficulty breathing  . Swelling of face and throat  . A fast heartbeat  . A bad rash all over body  . Dizziness and weakness   Immunizations Administered    Name Date Dose VIS Date Route   Pfizer COVID-19 Vaccine 01/07/2020  9:57 AM 0.3 mL 10/13/2019 Intramuscular   Manufacturer: [employer], [employer]   Lot: [id number]   NDC: [address]-7

## 2020-01-28 ENCOUNTER — Ambulatory Visit: Payer: Self-pay | Attending: Internal Medicine

## 2020-01-28 DIAGNOSIS — Z23 Encounter for immunization: Secondary | ICD-10-CM

## 2020-01-28 NOTE — Progress Notes (Signed)
   Covid-19 Vaccination Clinic  Name:  CHELBI HERBER    MRN: [id number] DOB: 03/14/1981  01/28/2020  Ms. Greenhouse was observed post Covid-19 immunization for 15 minutes without incident. She was provided with Vaccine Information Sheet and instruction to access the V-Safe system.   Ms. Mander was instructed to call 911 with any severe reactions post vaccine: Marland Kitchen Difficulty breathing  . Swelling of face and throat  . A fast heartbeat  . A bad rash all over body  . Dizziness and weakness   Immunizations Administered    Name Date Dose VIS Date Route   Pfizer COVID-19 Vaccine 01/28/2020  9:44 AM 0.3 mL 10/13/2019 Intramuscular   Manufacturer: [employer], [employer]   Lot: [id number]   NDC: [address]-0

## 2021-03-18 IMAGING — MG MM DIGITAL DIAGNOSTIC UNILAT*R* W/ TOMO W/ CAD
4 series · 4 of 12 positions shown · non-contrast
Comparison: 11/22/2019

ACR Breast Density Category a: The breast tissue is almost entirely
fatty.

CLINICAL DATA: Patient returns after baseline screening study for
evaluation of a possible RIGHT breast mass.

EXAM:
DIGITAL DIAGNOSTIC RIGHT MAMMOGRAM WITH TOMO
ULTRASOUND RIGHT BREAST

[R CC synth-2D]
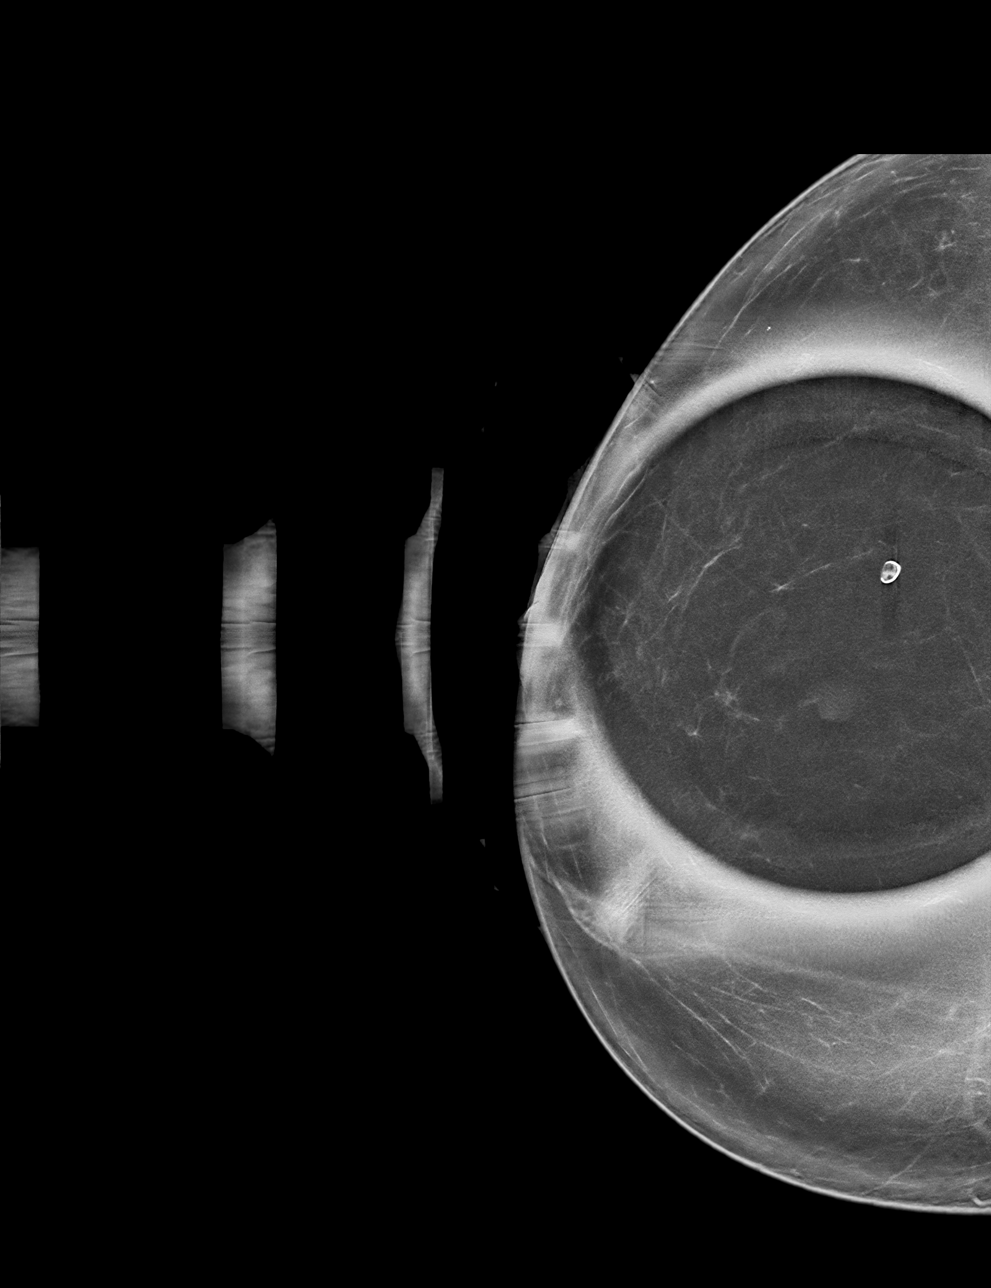

[R MLO synth-2D]
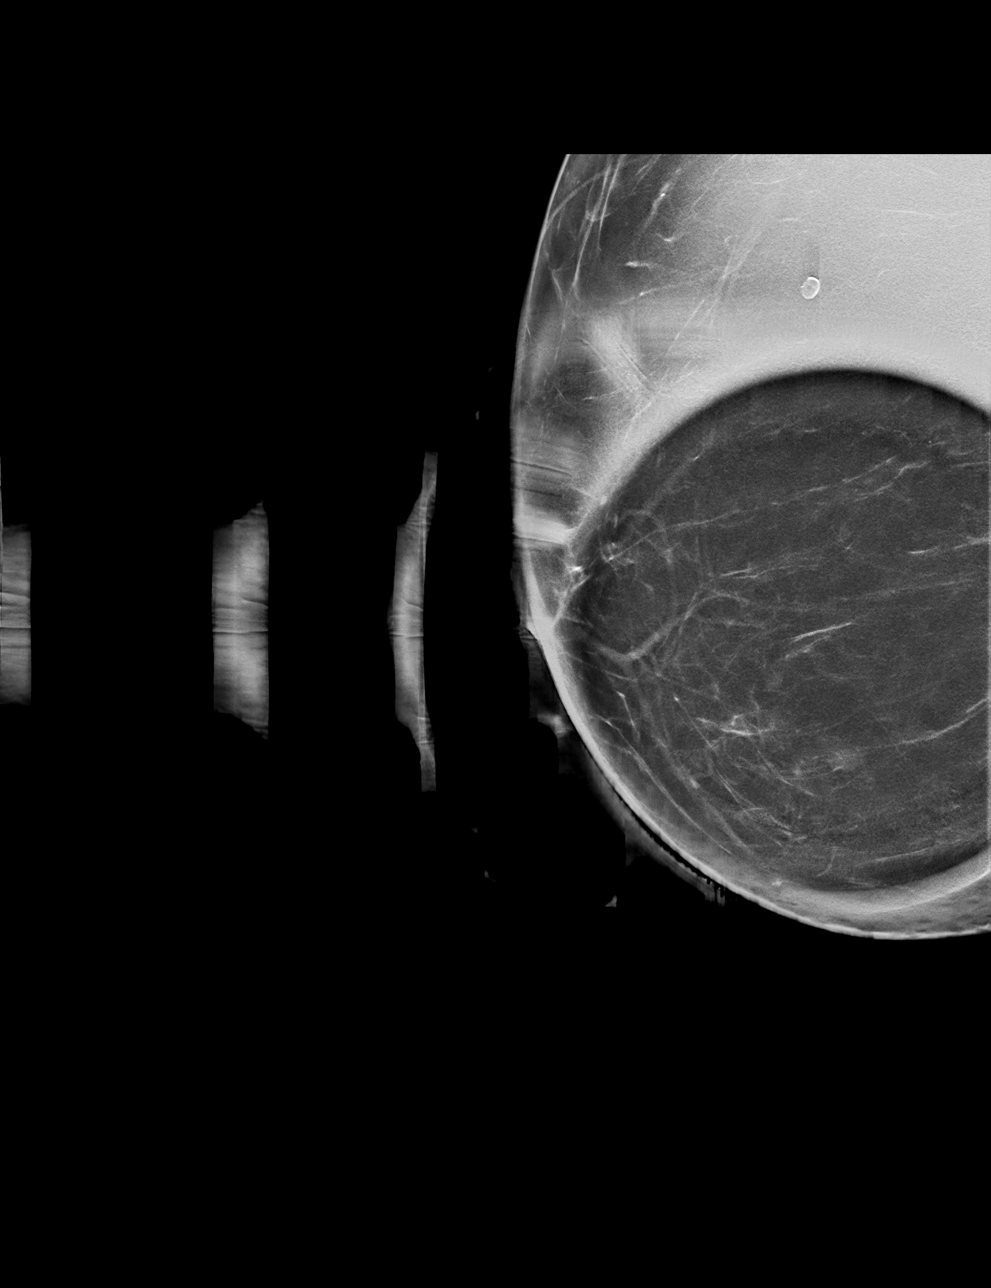

[R CC tomo · tomo slice 37/73.0]
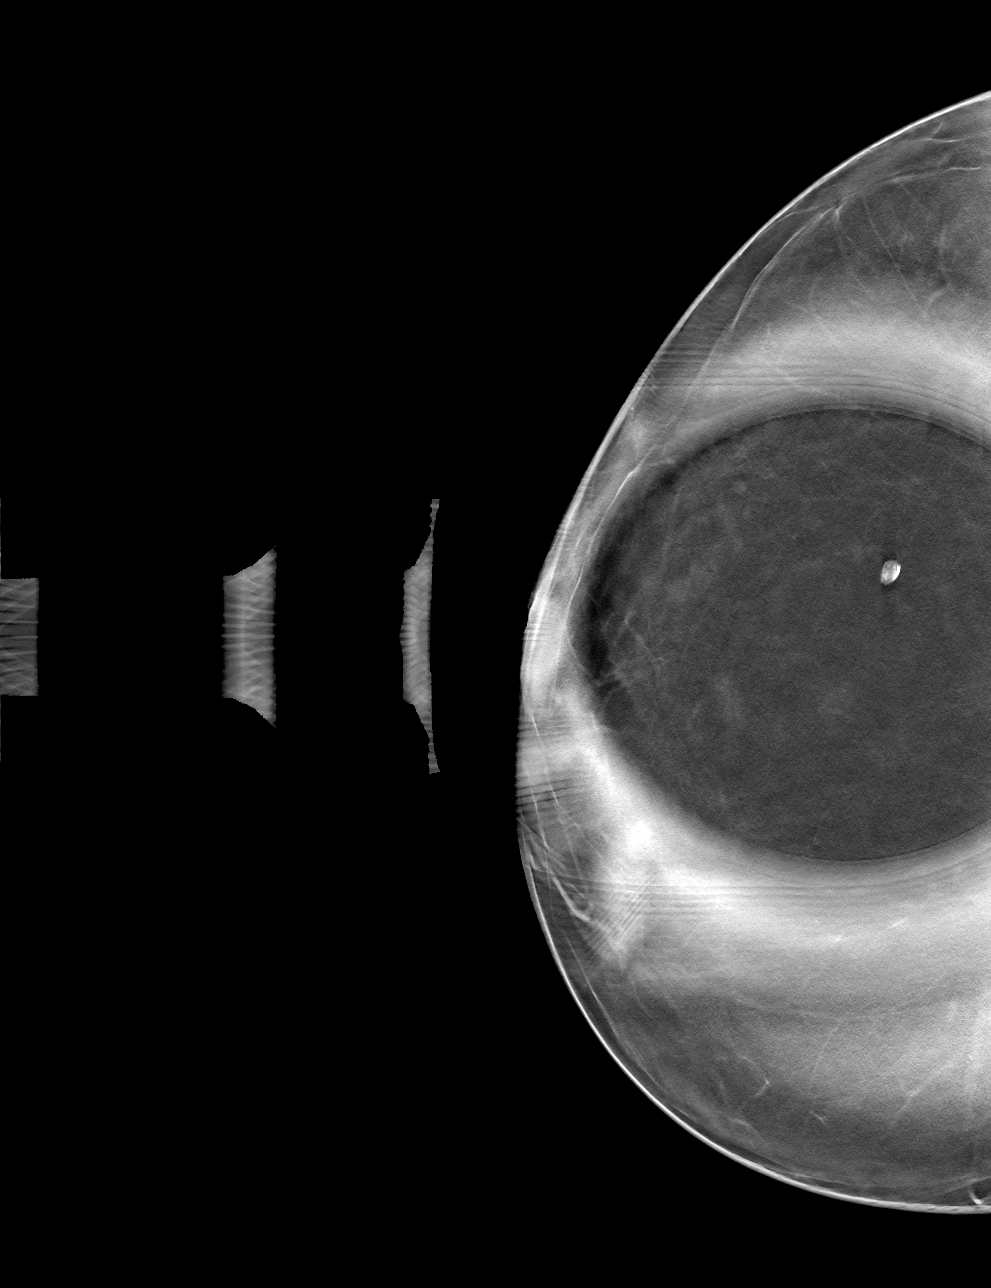

[R MLO tomo · tomo slice 39/77.0]
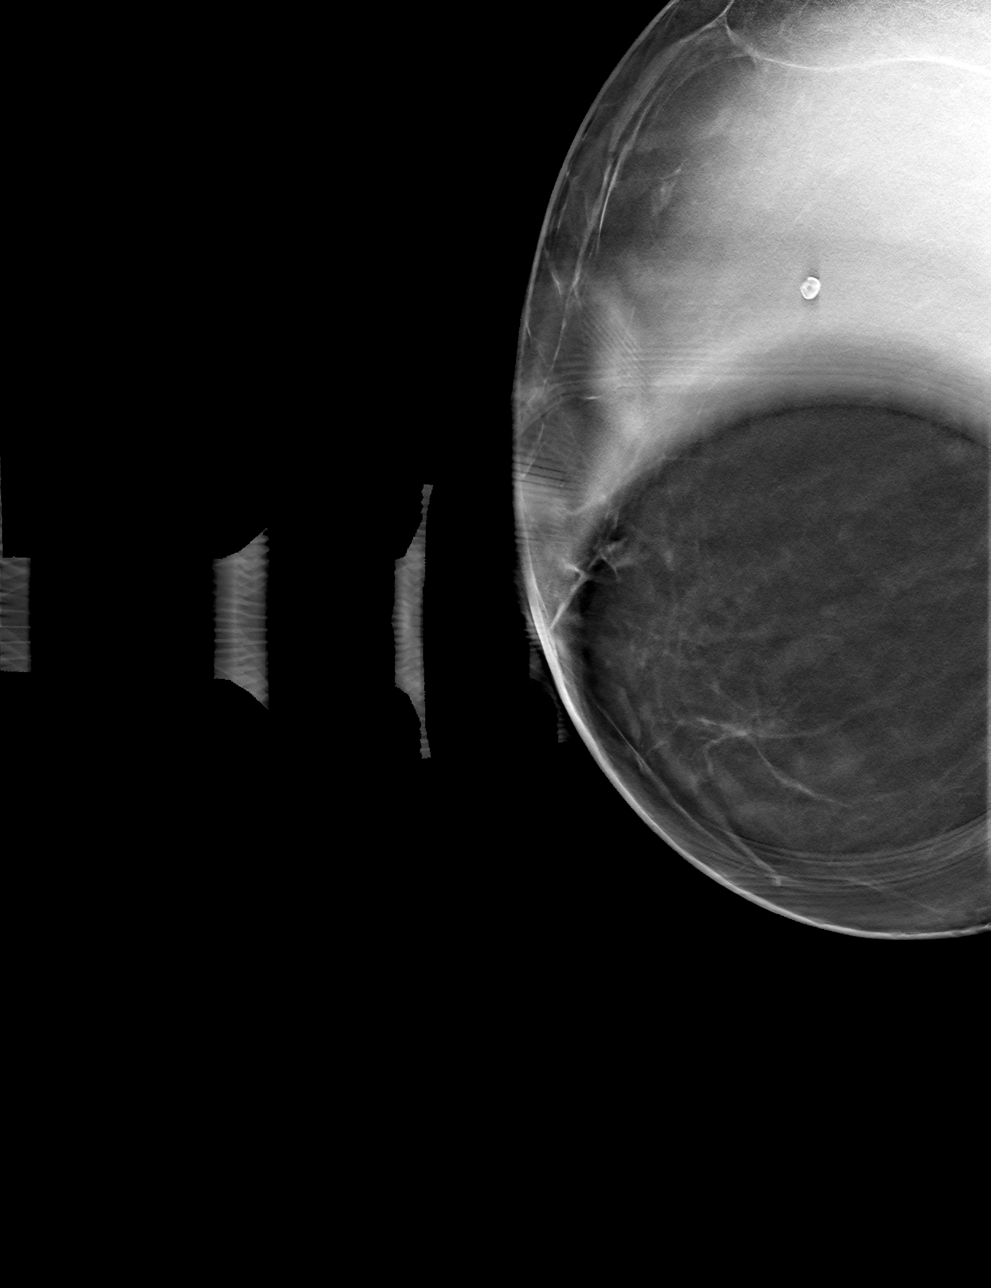

[4 of 12 positions shown; findings below may reference images not displayed]

FINDINGS: Additional 2-D and 3-D images are performed. These views confirm
presence of a partially obscured oval mass in the LOWER central
portion of the RIGHT breast.

Targeted ultrasound is performed, showing a circumscribed anechoic
parallel mass in the 5:30 o'clock location of the RIGHT breast 3
centimeters from the nipple. Mass measures 0.6 x 0.3 x
centimeters. A small partially calcified oil cyst is identified in
the 6 o'clock location 2 centimeters from the nipple which measures
0.38 x 0.3 x 0.3 centimeters.
IMPRESSION: 1.  No mammographic or ultrasound evidence for malignancy.
2. Simple cyst and adjacent oil cyst accounting for the mammographic
finding.

RECOMMENDATION:
Screening mammogram in one year.(Code:CF-W-QN7)

I have discussed the findings and recommendations with the patient.
If applicable, a reminder letter will be sent to the patient
regarding the next appointment.

BI-RADS CATEGORY  2: Benign.

## 2021-03-18 IMAGING — US US BREAST*R* LIMITED INC AXILLA
1 series · 11 of 11 positions shown · non-contrast
Comparison: 11/22/2019

ACR Breast Density Category a: The breast tissue is almost entirely
fatty.

CLINICAL DATA: Patient returns after baseline screening study for
evaluation of a possible RIGHT breast mass.

EXAM:
DIGITAL DIAGNOSTIC RIGHT MAMMOGRAM WITH TOMO
ULTRASOUND RIGHT BREAST

[Series 1: us breast*right* limited inc axilla · 0.06mm/px · 11 of 11 slices shown]
[im 1/11]
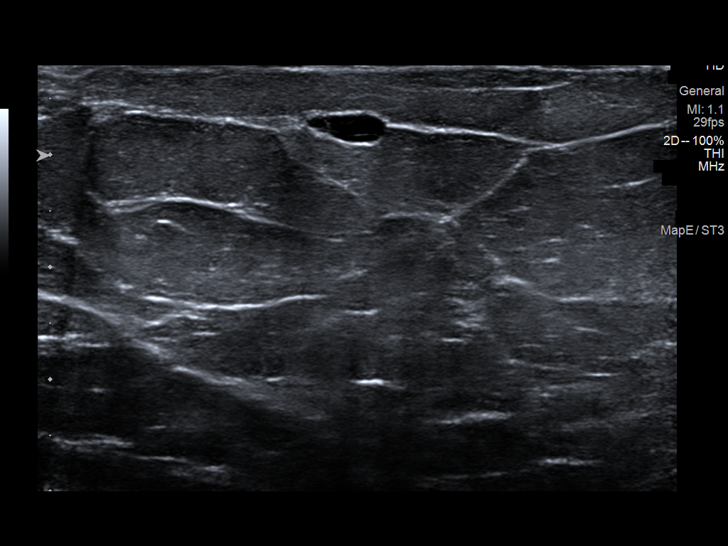
[im 2/11]
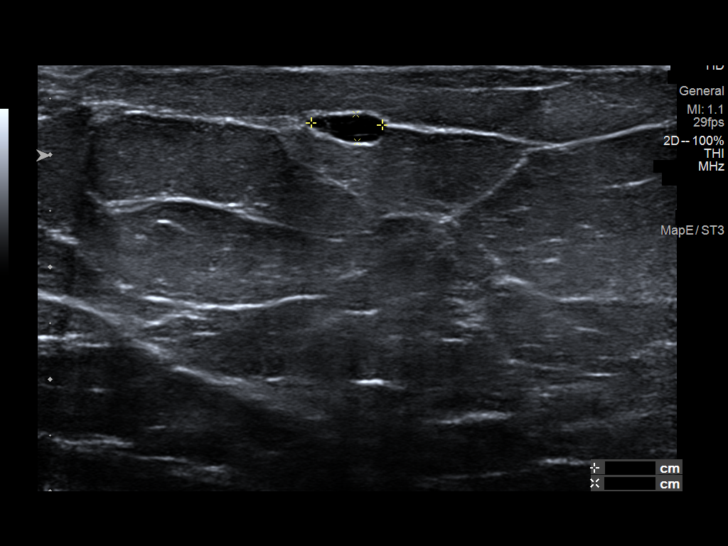
[im 3/11]
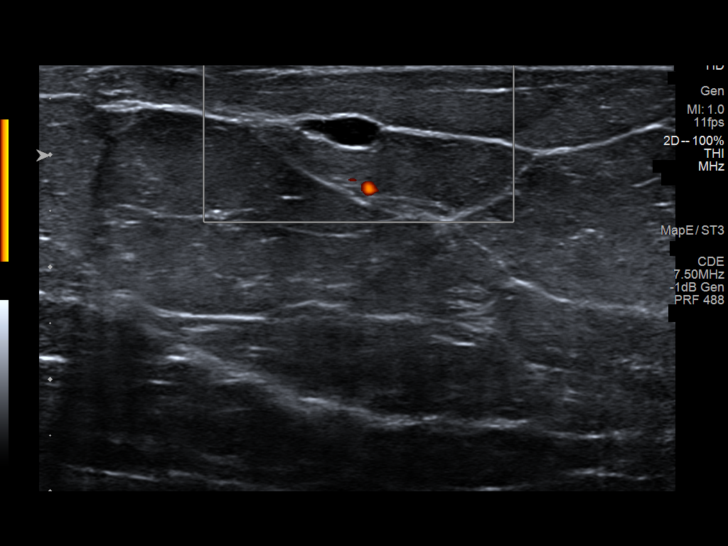
[im 4/11]
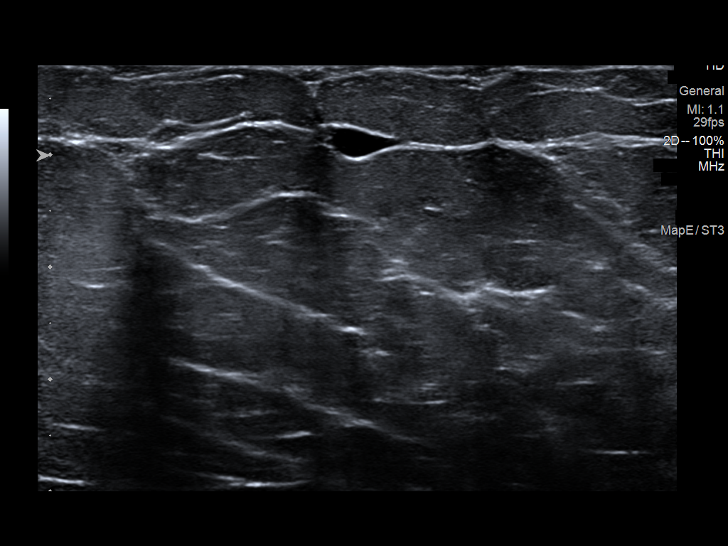
[im 5/11]
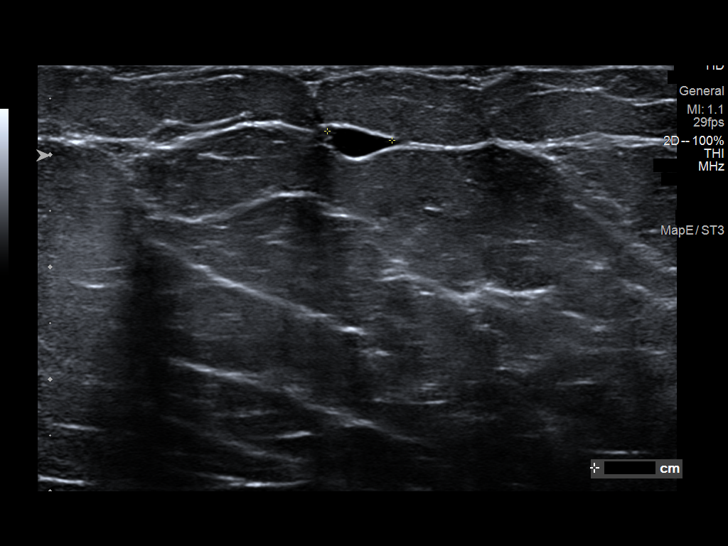
[im 6/11]
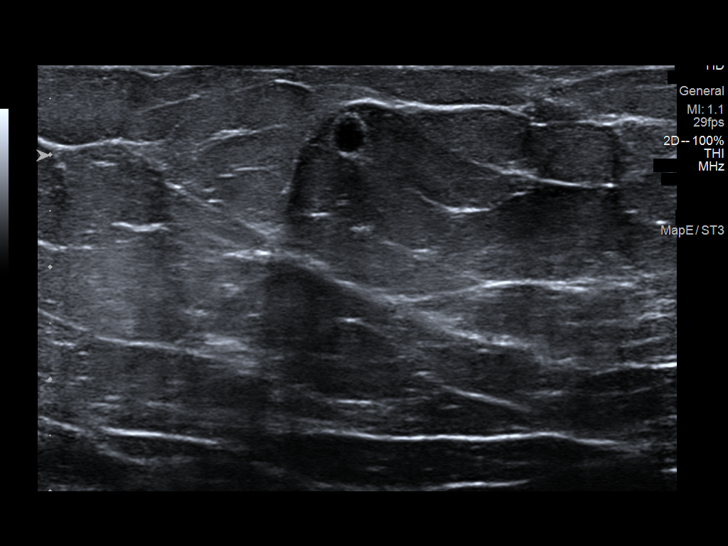
[im 7/11]
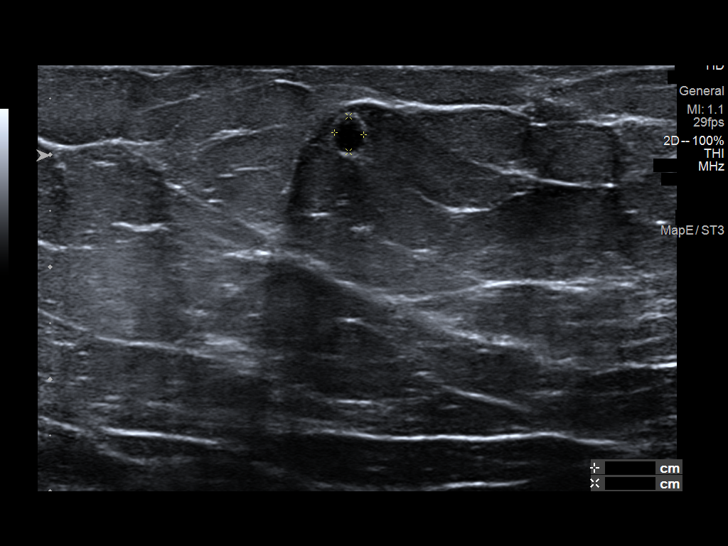
[im 8/11]
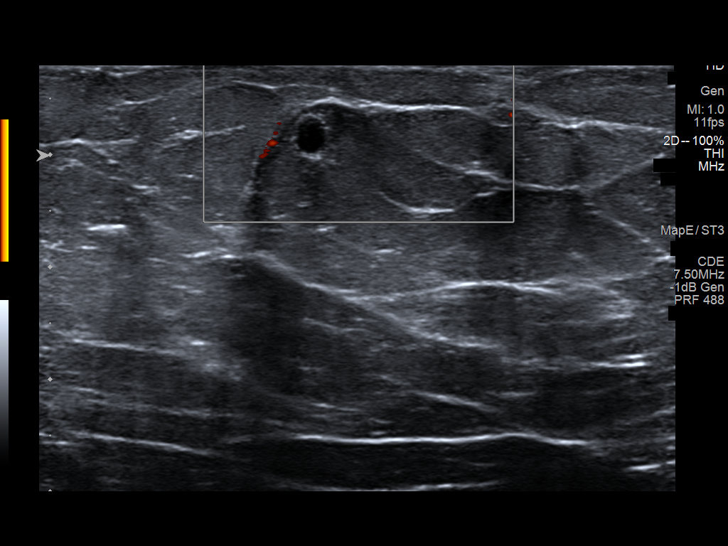
[im 9/11]
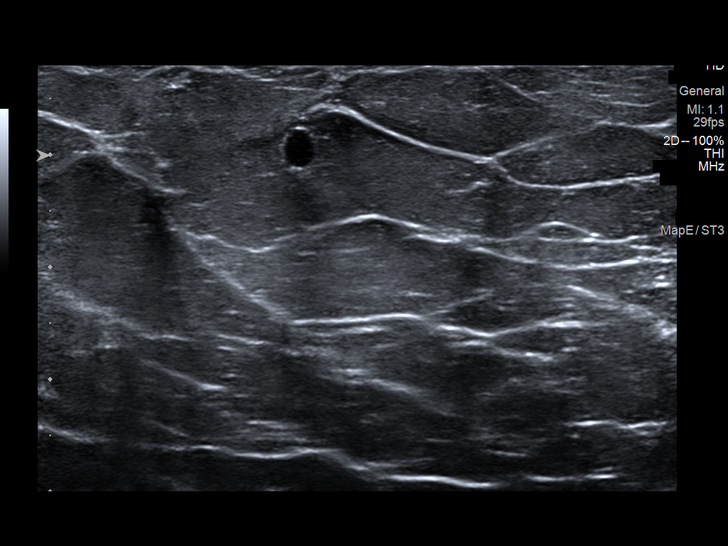
[im 10/11]
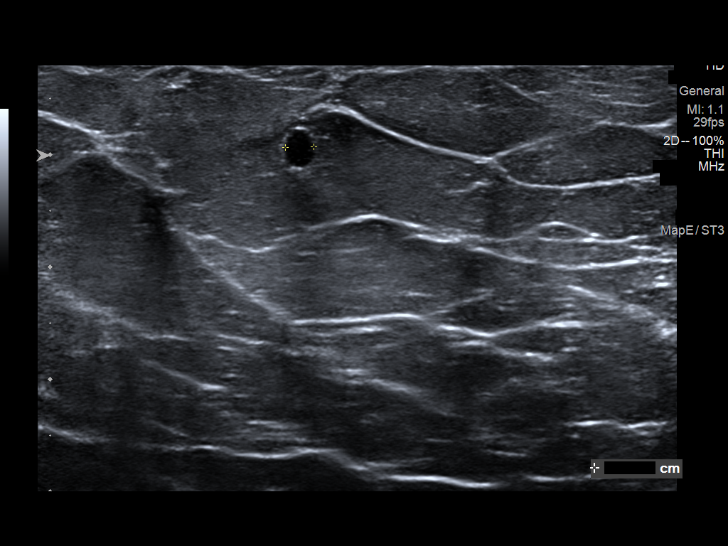
[im 11/11]
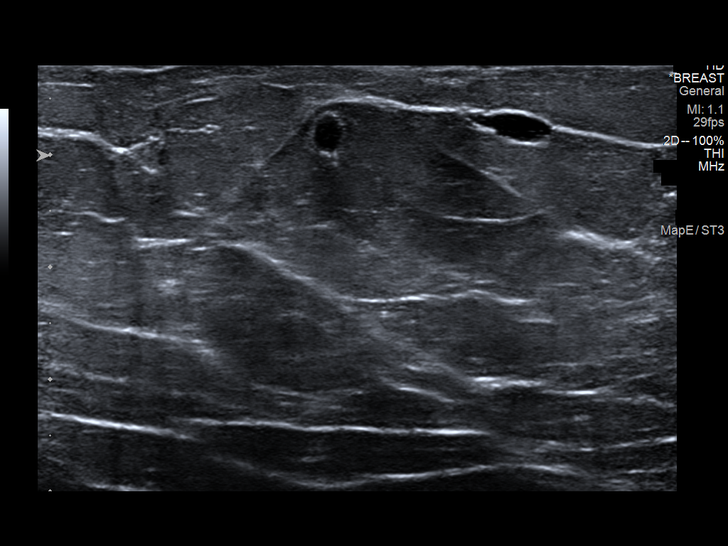

[11 of 11 positions shown; findings below may reference images not displayed]

FINDINGS: Additional 2-D and 3-D images are performed. These views confirm
presence of a partially obscured oval mass in the LOWER central
portion of the RIGHT breast.

Targeted ultrasound is performed, showing a circumscribed anechoic
parallel mass in the 5:30 o'clock location of the RIGHT breast 3
centimeters from the nipple. Mass measures 0.6 x 0.3 x
centimeters. A small partially calcified oil cyst is identified in
the 6 o'clock location 2 centimeters from the nipple which measures
0.38 x 0.3 x 0.3 centimeters.
IMPRESSION: 1.  No mammographic or ultrasound evidence for malignancy.
2. Simple cyst and adjacent oil cyst accounting for the mammographic
finding.

RECOMMENDATION:
Screening mammogram in one year.(Code:CF-W-QN7)

I have discussed the findings and recommendations with the patient.
If applicable, a reminder letter will be sent to the patient
regarding the next appointment.

BI-RADS CATEGORY  2: Benign.

## 2021-04-29 ENCOUNTER — Ambulatory Visit (HOSPITAL_BASED_OUTPATIENT_CLINIC_OR_DEPARTMENT_OTHER): Payer: BC Managed Care – PPO | Admitting: Obstetrics & Gynecology

## 2021-04-30 ENCOUNTER — Ambulatory Visit (HOSPITAL_BASED_OUTPATIENT_CLINIC_OR_DEPARTMENT_OTHER): Payer: BC Managed Care – PPO | Admitting: Obstetrics & Gynecology

## 2021-05-07 ENCOUNTER — Other Ambulatory Visit: Payer: Self-pay | Admitting: Obstetrics & Gynecology

## 2021-05-07 DIAGNOSIS — Z1231 Encounter for screening mammogram for malignant neoplasm of breast: Secondary | ICD-10-CM

## 2021-05-16 ENCOUNTER — Encounter (HOSPITAL_BASED_OUTPATIENT_CLINIC_OR_DEPARTMENT_OTHER): Payer: Self-pay

## 2021-05-16 ENCOUNTER — Other Ambulatory Visit: Payer: Self-pay

## 2021-05-16 ENCOUNTER — Other Ambulatory Visit (HOSPITAL_COMMUNITY)
Admission: RE | Admit: 2021-05-16 | Discharge: 2021-05-16 | Disposition: A | Discharge status: Home / Self Care | Payer: BC Managed Care – PPO | Source: Ambulatory Visit | Attending: Obstetrics & Gynecology | Admitting: Obstetrics & Gynecology

## 2021-05-16 ENCOUNTER — Encounter (HOSPITAL_BASED_OUTPATIENT_CLINIC_OR_DEPARTMENT_OTHER): Payer: Self-pay | Admitting: Obstetrics & Gynecology

## 2021-05-16 ENCOUNTER — Ambulatory Visit (HOSPITAL_BASED_OUTPATIENT_CLINIC_OR_DEPARTMENT_OTHER): Payer: BC Managed Care – PPO | Admitting: Obstetrics & Gynecology

## 2021-05-16 VITALS — BP 130/81 | HR 75 | Ht 64.0 in | Wt 187.2 lb

## 2021-05-16 DIAGNOSIS — N898 Other specified noninflammatory disorders of vagina: Secondary | ICD-10-CM | POA: Insufficient documentation

## 2021-05-16 DIAGNOSIS — Z8049 Family history of malignant neoplasm of other genital organs: Secondary | ICD-10-CM | POA: Insufficient documentation

## 2021-05-16 DIAGNOSIS — Z124 Encounter for screening for malignant neoplasm of cervix: Secondary | ICD-10-CM

## 2021-05-16 DIAGNOSIS — Z01419 Encounter for gynecological examination (general) (routine) without abnormal findings: Secondary | ICD-10-CM | POA: Diagnosis not present

## 2021-05-16 MED ORDER — FLUCONAZOLE 150 MG PO TABS: 150.0000 mg | ORAL_TABLET | Freq: Once | ORAL | 0 refills | Status: AC

## 2021-05-16 NOTE — Progress Notes (Signed)
40 y.o. G0P0000 Married White or Caucasian female here for annual exam/new patient exam.  She is the daughter of Abe People.  Pt's mother had endometrial cancer in her mid 14's.  Pt has regular cycles.  They last 4-5 days.  She does have some personal concern about her mother's hx.  Her mother was referred to medical genetics for genetic testing but she did not proceed.  Pt is going to discuss with her mother.    Patient's last menstrual period was 05/01/2021.          Sexually active: Yes.    The current method of family planning is vasectomy.    Smoker:  no  Health Maintenance: Pap:  unsure of last done History of abnormal Pap:  no MMG:  scheduled for 05/20/21 Colonoscopy:  not indicated yet TDaP:  05/04/13   reports that she has never smoked. She has never used smokeless tobacco. She reports current alcohol use. She reports that she does not use drugs.  Past Medical History:  Diagnosis Date   Anxiety    GERD (gastroesophageal reflux disease)    OCD (obsessive compulsive disorder)     Past Surgical History:  Procedure Laterality Date   WISDOM TOOTH EXTRACTION      Current Outpatient Medications  Medication Sig Dispense Refill   citalopram (CELEXA) 10 MG tablet Take 1 tablet (10 mg total) by mouth daily. (Patient taking differently: Take 30 mg by mouth daily.) 30 tablet 0   fluticasone (FLONASE) 50 MCG/ACT nasal spray 1-2 sprays in each nostril daily as needed for allergies  5   levocetirizine (XYZAL) 5 MG tablet Take 5 mg by mouth every evening.     naproxen sodium (ANAPROX) 220 MG tablet Take 220 mg by mouth daily as needed (HA).     clonazePAM (KLONOPIN) 0.5 MG tablet Take 1 tablet (0.5 mg total) by mouth at bedtime. (Patient not taking: Reported on 05/16/2021) 30 tablet 0   No current facility-administered medications for this visit.    Family History  Problem Relation Age of Onset   Cancer Paternal Grandfather        lung   Diabetes Paternal Grandmother    Heart  attack Paternal Grandmother    Diabetes Maternal Grandmother    Heart disease Maternal Grandmother    Colon cancer Maternal Grandfather    Cancer Maternal Grandfather        lung and colon   Diabetes Father    Hypertension Father    Hyperlipidemia Father    Cancer Mother        ovarian or uterine    Review of Systems  All other systems reviewed and are negative.  Exam:   Ht 5\' 4"  (1.626 m)   Wt 187 lb 3.2 oz (84.9 kg)   LMP 05/01/2021   BMI 32.13 kg/m   Height: 5\' 4"  (162.6 cm)  General appearance: alert, cooperative and appears stated age Head: Normocephalic, without obvious abnormality, atraumatic Neck: no adenopathy, supple, symmetrical, trachea midline and thyroid normal to inspection and palpation Lungs: clear to auscultation bilaterally Breasts: normal appearance, no masses or tenderness Heart: regular rate and rhythm Abdomen: soft, non-tender; bowel sounds normal; no masses,  no organomegaly Extremities: extremities normal, atraumatic, no cyanosis or edema Skin: Skin color, texture, turgor normal. No rashes or lesions Lymph nodes: Cervical, supraclavicular, and axillary nodes normal. No abnormal inguinal nodes palpated Neurologic: Grossly normal   Pelvic: External genitalia:  no lesions  Urethra:  normal appearing urethra with no masses, tenderness or lesions              Bartholins and Skenes: normal                 Vagina: normal appearing vagina with normal color and no discharge, no lesions              Cervix: no lesions              Pap taken: Yes.  Pap smear held until we have prior records. Bimanual Exam:  Uterus:  normal size, contour, position, consistency, mobility, non-tender              Adnexa: normal adnexa and no mass, fullness, tenderness               Rectovaginal: Confirms               Anus:  normal sphincter tone, no lesions  Chaperone, Ina Homes, CMA, was present for exam.  Assessment/Plan: 1. Well woman exam with routine  gynecological exam - will get copy of outside records - MMG scheduled 05/20/2021 - colon cancer screening guidelines reviewed - vaccines updated - lab work done with PCP  2. Vaginal irritation - Cervicovaginal ancillary only( [facility]) - fluconazole (DIFLUCAN) 150 MG tablet; Take 1 tablet (150 mg total) by mouth once for 1 dose. Repeat in 72 hours if symptoms are not completely resolved.  Dispense: 2 tablet; Refill: 0  3. Family history of malignant neoplasm of endometrium - suggested pt discussing genetic testing with her mother.  If mother declines, will refer pt for testing.  - if negative, we discussed possible screening with ultrasound or endoemtrial biopsy.  Pt understands that there are no good screening guidelines/recommendations for this situation if genetic testing is negative  4. Cervical cancer screening - pt desires yearly pap smear.  Pap obtained and held.  Will send once I have outside records and know whether she has had recent HR HPV testing.

## 2021-05-19 LAB — CERVICOVAGINAL ANCILLARY ONLY
Bacterial Vaginitis (gardnerella): NEGATIVE
Candida Glabrata: NEGATIVE
Candida Vaginitis: POSITIVE — AB
Comment: NEGATIVE
Comment: NEGATIVE
Comment: NEGATIVE

## 2021-05-20 ENCOUNTER — Ambulatory Visit
Admission: RE | Admit: 2021-05-20 | Discharge: 2021-05-20 | Disposition: A | Discharge status: Home / Self Care | Payer: BC Managed Care – PPO | Source: Ambulatory Visit | Attending: Obstetrics & Gynecology | Admitting: Obstetrics & Gynecology

## 2021-05-20 ENCOUNTER — Other Ambulatory Visit: Payer: Self-pay

## 2021-05-20 DIAGNOSIS — Z1231 Encounter for screening mammogram for malignant neoplasm of breast: Secondary | ICD-10-CM

## 2021-06-30 ENCOUNTER — Ambulatory Visit: Payer: BC Managed Care – PPO

## 2021-10-26 ENCOUNTER — Encounter (HOSPITAL_BASED_OUTPATIENT_CLINIC_OR_DEPARTMENT_OTHER): Payer: Self-pay | Admitting: Obstetrics & Gynecology

## 2021-10-30 ENCOUNTER — Other Ambulatory Visit (HOSPITAL_COMMUNITY)
Admission: RE | Admit: 2021-10-30 | Discharge: 2021-10-30 | Disposition: A | Discharge status: Home / Self Care | Payer: BC Managed Care – PPO | Source: Ambulatory Visit | Attending: Obstetrics & Gynecology | Admitting: Obstetrics & Gynecology

## 2021-10-30 ENCOUNTER — Ambulatory Visit (HOSPITAL_BASED_OUTPATIENT_CLINIC_OR_DEPARTMENT_OTHER): Payer: BC Managed Care – PPO

## 2021-10-30 ENCOUNTER — Other Ambulatory Visit: Payer: Self-pay

## 2021-10-30 DIAGNOSIS — N898 Other specified noninflammatory disorders of vagina: Secondary | ICD-10-CM | POA: Diagnosis not present

## 2021-10-30 NOTE — Progress Notes (Signed)
Patient came in today to do a self aptima swab. Patient has complaints of itching, vaginal discharge with some smell. Patient states she also have some small bumps in the vaginal area. Patient did set up an appointment to be seen on 11/07/2021 with Raynelle Fanning. tbw

## 2021-11-04 LAB — CERVICOVAGINAL ANCILLARY ONLY
Bacterial Vaginitis (gardnerella): NEGATIVE
Candida Glabrata: NEGATIVE
Candida Vaginitis: POSITIVE — AB
Comment: NEGATIVE
Comment: NEGATIVE
Comment: NEGATIVE

## 2021-11-05 ENCOUNTER — Other Ambulatory Visit (HOSPITAL_BASED_OUTPATIENT_CLINIC_OR_DEPARTMENT_OTHER): Payer: Self-pay | Admitting: *Deleted

## 2021-11-05 MED ORDER — FLUCONAZOLE 150 MG PO TABS: 150.0000 mg | ORAL_TABLET | Freq: Once | ORAL | 0 refills | Status: AC

## 2021-11-07 ENCOUNTER — Ambulatory Visit (HOSPITAL_BASED_OUTPATIENT_CLINIC_OR_DEPARTMENT_OTHER): Payer: BC Managed Care – PPO | Admitting: Medical

## 2021-11-10 ENCOUNTER — Ambulatory Visit (HOSPITAL_BASED_OUTPATIENT_CLINIC_OR_DEPARTMENT_OTHER): Payer: BC Managed Care – PPO | Admitting: Obstetrics & Gynecology

## 2021-11-10 ENCOUNTER — Other Ambulatory Visit (HOSPITAL_COMMUNITY)
Admission: RE | Admit: 2021-11-10 | Discharge: 2021-11-10 | Disposition: A | Discharge status: Home / Self Care | Payer: BC Managed Care – PPO | Source: Ambulatory Visit | Attending: Obstetrics & Gynecology | Admitting: Obstetrics & Gynecology

## 2021-11-10 ENCOUNTER — Encounter (HOSPITAL_BASED_OUTPATIENT_CLINIC_OR_DEPARTMENT_OTHER): Payer: Self-pay | Admitting: Obstetrics & Gynecology

## 2021-11-10 ENCOUNTER — Other Ambulatory Visit: Payer: Self-pay

## 2021-11-10 VITALS — BP 113/68 | HR 91 | Ht 64.0 in | Wt 186.6 lb

## 2021-11-10 DIAGNOSIS — N898 Other specified noninflammatory disorders of vagina: Secondary | ICD-10-CM | POA: Diagnosis not present

## 2021-11-10 DIAGNOSIS — N926 Irregular menstruation, unspecified: Secondary | ICD-10-CM

## 2021-11-10 DIAGNOSIS — N760 Acute vaginitis: Secondary | ICD-10-CM

## 2021-11-10 LAB — HEMOGLOBIN A1C
Est. average glucose Bld gHb Est-mCnc: 111 mg/dL
Hgb A1c MFr Bld: 5.5 % (ref 4.8–5.6)

## 2021-11-10 MED ORDER — FLUCONAZOLE 150 MG PO TABS: ORAL_TABLET | ORAL | 0 refills | Status: DC

## 2021-11-10 MED ORDER — TERCONAZOLE 0.4 % VA CREA
1.0000 | TOPICAL_CREAM | Freq: Every day | VAGINAL | 0 refills | Status: AC
Start: 2021-11-10 — End: 2021-11-17

## 2021-11-10 NOTE — Progress Notes (Signed)
GYNECOLOGY  VISIT  CC:   vulvar bumps, vaginal concerns  HPI: 41 y.o. G0P0000 Married White or Caucasian female here for complaint of vulvar bumps that she wants evaluated.  She is also complaining of some vaginal irritation.  Was tested 12/29 for yeast and was treated with fluconazole x 1, repeat in 72 hours.  She continues to feel there is discharge.  There is also some odor.  No STD concern.  Over the past several months, she has a regular cycle that 4-5 days, bleeding stops for 2-3 days and then it restarts again.  This did not occur until last year.    GYNECOLOGIC HISTORY: Patient's last menstrual period was 10/24/2021. Contraception: vasectomy  Patient Active Problem List   Diagnosis Date Noted   Family history of malignant neoplasm of endometrium 05/16/2021   OCD (obsessive compulsive disorder) 08/06/2017   GAD (generalized anxiety disorder) 08/06/2017    Past Medical History:  Diagnosis Date   Anxiety    GERD (gastroesophageal reflux disease)    OCD (obsessive compulsive disorder)     Past Surgical History:  Procedure Laterality Date   WISDOM TOOTH EXTRACTION      MEDS:   Current Outpatient Medications on File Prior to Visit  Medication Sig Dispense Refill   citalopram (CELEXA) 10 MG tablet Take 1 tablet (10 mg total) by mouth daily. (Patient taking differently: Take 30 mg by mouth daily.) 30 tablet 0   clonazePAM (KLONOPIN) 0.5 MG tablet Take 1 tablet (0.5 mg total) by mouth at bedtime. 30 tablet 0   fluticasone (FLONASE) 50 MCG/ACT nasal spray 1-2 sprays in each nostril daily as needed for allergies  5   levocetirizine (XYZAL) 5 MG tablet Take 5 mg by mouth every evening.     naproxen sodium (ANAPROX) 220 MG tablet Take 220 mg by mouth daily as needed (HA).     No current facility-administered medications on file prior to visit.    ALLERGIES: Patient has no known allergies.  Family History  Problem Relation Age of Onset   Cancer Paternal Grandfather         lung   Diabetes Paternal Grandmother    Heart attack Paternal Grandmother    Diabetes Maternal Grandmother    Heart disease Maternal Grandmother    Colon cancer Maternal Grandfather    Cancer Maternal Grandfather        lung and colon   Diabetes Father    Hypertension Father    Hyperlipidemia Father    Cancer Mother        ovarian or uterine    SH:  married, non smoker  Review of Systems  Genitourinary:        Vaginal irritation, discharge   PHYSICAL EXAMINATION:    BP 113/68 (BP Location: Left Arm, Patient Position: Sitting, Cuff Size: Large)    Pulse 91    Ht 5\' 4"  (1.626 m) Comment: reported   Wt 186 lb 9.6 oz (84.6 kg)    LMP 10/24/2021    BMI 32.03 kg/m     General appearance: alert, cooperative and appears stated age Lymph:  no inguinal LAD noted  Pelvic: External genitalia:  no lesions, erythema in inner labia majora, small amount whitish discharge              Urethra:  normal appearing urethra with no masses, tenderness or lesions              Bartholins and Skenes: normal  Vagina: whitish discharge, erythema noted              Cervix: no lesions               Chaperone, Octaviano Batty, CMA, was present for exam.  Assessment/Plan: 1. Irregular bleeding - US PELVIS TRANSVAGINAL NON-OB (TV ONLY); Future  2. Vaginal irritation - Cervicovaginal ancillary only( [place]) - fluconazole (DIFLUCAN) 150 MG tablet; Take 1 tab and repeat every 3 days for 3 doses  Dispense: 3 tablet; Refill: 0 - terconazole (TERAZOL 7) 0.4 % vaginal cream; Place 1 applicator vaginally at bedtime for 7 days.  Dispense: 45 g; Refill: 0  3. Recurrent vaginitis - Hemoglobin A1c

## 2021-11-11 ENCOUNTER — Encounter (HOSPITAL_BASED_OUTPATIENT_CLINIC_OR_DEPARTMENT_OTHER): Payer: Self-pay | Admitting: Obstetrics & Gynecology

## 2021-11-11 LAB — CERVICOVAGINAL ANCILLARY ONLY
Bacterial Vaginitis (gardnerella): NEGATIVE
Candida Glabrata: NEGATIVE
Candida Vaginitis: NEGATIVE
Comment: NEGATIVE
Comment: NEGATIVE
Comment: NEGATIVE

## 2021-11-19 ENCOUNTER — Ambulatory Visit (HOSPITAL_BASED_OUTPATIENT_CLINIC_OR_DEPARTMENT_OTHER): Payer: BC Managed Care – PPO | Admitting: Obstetrics & Gynecology

## 2021-11-19 ENCOUNTER — Encounter (HOSPITAL_BASED_OUTPATIENT_CLINIC_OR_DEPARTMENT_OTHER): Payer: Self-pay | Admitting: Obstetrics & Gynecology

## 2021-11-19 ENCOUNTER — Other Ambulatory Visit: Payer: Self-pay

## 2021-11-19 ENCOUNTER — Ambulatory Visit (INDEPENDENT_AMBULATORY_CARE_PROVIDER_SITE_OTHER): Payer: BC Managed Care – PPO

## 2021-11-19 VITALS — BP 128/66 | HR 77 | Ht 64.0 in | Wt 186.0 lb

## 2021-11-19 DIAGNOSIS — N926 Irregular menstruation, unspecified: Secondary | ICD-10-CM

## 2021-11-19 MED ORDER — NORETHINDRONE 0.35 MG PO TABS: 1.0000 | ORAL_TABLET | Freq: Every day | ORAL | 1 refills | Status: DC

## 2021-11-19 NOTE — Progress Notes (Signed)
GYNECOLOGY  VISIT  CC:   discuss ultrasound  HPI: 41 y.o. G0P0000 Married White or Caucasian female here for discussion of ultrasound.  Small fibroids noted.  Normal endometrium.  Ovaries with some peripheral follicles but not diagnostic for PCOS.  Pt is desirous to have some hormonal testing done today.  We disucssed treatment option includes OCPs, POPs, progesterone options for first line treatment.  POPs, risks, and side effects discussed.  Because of safety of these, will start with this.  Pt aware this is continuous active and she can start now or with next bleeding episode.  Patient Active Problem List   Diagnosis Date Noted   Family history of malignant neoplasm of endometrium 05/16/2021   OCD (obsessive compulsive disorder) 08/06/2017   GAD (generalized anxiety disorder) 08/06/2017    Past Medical History:  Diagnosis Date   Anxiety    GERD (gastroesophageal reflux disease)    OCD (obsessive compulsive disorder)     Past Surgical History:  Procedure Laterality Date   WISDOM TOOTH EXTRACTION      MEDS:   Current Outpatient Medications on File Prior to Visit  Medication Sig Dispense Refill   citalopram (CELEXA) 10 MG tablet Take 1 tablet (10 mg total) by mouth daily. (Patient taking differently: Take 30 mg by mouth daily.) 30 tablet 0   clonazePAM (KLONOPIN) 0.5 MG tablet Take 1 tablet (0.5 mg total) by mouth at bedtime. 30 tablet 0   fluconazole (DIFLUCAN) 150 MG tablet Take 1 tab and repeat every 3 days for 3 doses 3 tablet 0   fluticasone (FLONASE) 50 MCG/ACT nasal spray 1-2 sprays in each nostril daily as needed for allergies  5   levocetirizine (XYZAL) 5 MG tablet Take 5 mg by mouth every evening.     naproxen sodium (ANAPROX) 220 MG tablet Take 220 mg by mouth daily as needed (HA).     No current facility-administered medications on file prior to visit.    ALLERGIES: Patient has no known allergies.  Family History  Problem Relation Age of Onset   Cancer Paternal  Grandfather        lung   Diabetes Paternal Grandmother    Heart attack Paternal Grandmother    Diabetes Maternal Grandmother    Heart disease Maternal Grandmother    Colon cancer Maternal Grandfather    Cancer Maternal Grandfather        lung and colon   Diabetes Father    Hypertension Father    Hyperlipidemia Father    Cancer Mother        ovarian or uterine    SH:  married, non smoker  Review of Systems  Constitutional: Negative.   Genitourinary:        Irregular bleeding   PHYSICAL EXAMINATION:    BP 128/66 (BP Location: Right Arm, Patient Position: Sitting, Cuff Size: Large)    Pulse 77    Ht 5\' 4"  (1.626 m) Comment: reported   Wt 186 lb (84.4 kg)    LMP 10/24/2021    BMI 31.93 kg/m     Physical Exam Constitutional:      Appearance: Normal appearance.  Neurological:     General: No focal deficit present.     Mental Status: She is alert.  Psychiatric:        Mood and Affect: Mood normal.    Assessment/Plan: 1. Irregular periods/menstrual cycles - will start micronor 1 tab daily.  #3 month supply with RFs - Follicle stimulating hormone - Estradiol - pt has  AEX in June so will plan to follow up at that time

## 2021-11-20 LAB — ESTRADIOL: Estradiol: 76.3 pg/mL

## 2021-11-20 LAB — FOLLICLE STIMULATING HORMONE: FSH: 9.3 m[IU]/mL

## 2021-12-25 DIAGNOSIS — R141 Gas pain: Secondary | ICD-10-CM | POA: Insufficient documentation

## 2021-12-25 DIAGNOSIS — K589 Irritable bowel syndrome without diarrhea: Secondary | ICD-10-CM | POA: Insufficient documentation

## 2022-05-07 ENCOUNTER — Other Ambulatory Visit (HOSPITAL_BASED_OUTPATIENT_CLINIC_OR_DEPARTMENT_OTHER): Payer: Self-pay | Admitting: Obstetrics & Gynecology

## 2022-05-28 ENCOUNTER — Ambulatory Visit (HOSPITAL_BASED_OUTPATIENT_CLINIC_OR_DEPARTMENT_OTHER): Payer: BC Managed Care – PPO | Admitting: Obstetrics & Gynecology

## 2022-07-21 ENCOUNTER — Encounter (HOSPITAL_BASED_OUTPATIENT_CLINIC_OR_DEPARTMENT_OTHER): Payer: Self-pay | Admitting: Advanced Practice Midwife

## 2022-07-21 ENCOUNTER — Ambulatory Visit (INDEPENDENT_AMBULATORY_CARE_PROVIDER_SITE_OTHER): Payer: BC Managed Care – PPO | Admitting: Advanced Practice Midwife

## 2022-07-21 ENCOUNTER — Other Ambulatory Visit (HOSPITAL_COMMUNITY)
Admission: RE | Admit: 2022-07-21 | Discharge: 2022-07-21 | Disposition: A | Discharge status: Home / Self Care | Payer: BC Managed Care – PPO | Source: Ambulatory Visit | Attending: Advanced Practice Midwife | Admitting: Advanced Practice Midwife

## 2022-07-21 VITALS — BP 134/76 | HR 72 | Ht 64.0 in | Wt 186.2 lb

## 2022-07-21 DIAGNOSIS — Z1231 Encounter for screening mammogram for malignant neoplasm of breast: Secondary | ICD-10-CM | POA: Diagnosis not present

## 2022-07-21 DIAGNOSIS — Z124 Encounter for screening for malignant neoplasm of cervix: Secondary | ICD-10-CM | POA: Diagnosis present

## 2022-07-21 DIAGNOSIS — Z01419 Encounter for gynecological examination (general) (routine) without abnormal findings: Secondary | ICD-10-CM

## 2022-07-21 NOTE — Progress Notes (Signed)
   Subjective:     Sandra Calhoun is a 41 y.o. female here at [facility] for a routine exam.  Current complaints: none.  Personal health questionnaire reviewed: yes.  Do you have a primary care provider? yes Do you feel safe at home? yes  [facility] Visit from 07/21/2022 in [facility]  PHQ-2 Total Score 0       Health Maintenance Due  Topic Date Due   HIV Screening  Never done   Hepatitis C Screening  Never done   PAP SMEAR-Modifier  Never done   COVID-19 Vaccine (4 - Pfizer series) 12/26/2020   INFLUENZA VACCINE  06/02/2022     Risk factors for chronic health problems: Smoking: Alchohol/how much: Pt BMI: Body mass index is 31.96 kg/m.   Gynecologic History Patient's last menstrual period was 07/09/2022 (approximate). Contraception: vasectomy Last Pap: unsure. Results were: normal per pt Last mammogram: 2022. Results were: normal  Obstetric History OB History  Gravida Para Term Preterm AB Living  0 0 0 0 0 0  SAB IAB Ectopic Multiple Live Births  0 0 0 0 0     The following portions of the patient's history were reviewed and updated as appropriate: allergies, current medications, past family history, past medical history, past social history, past surgical history, and problem list.  Review of Systems Pertinent items noted in HPI and remainder of comprehensive ROS otherwise negative.    Objective:   BP 134/76 (BP Location: Right Arm, Patient Position: Sitting, Cuff Size: Large)   Pulse 72   Ht 5\' 4"  (1.626 m) Comment: reported  Wt 186 lb 3.2 oz (84.5 kg)   LMP 07/09/2022 (Approximate)   BMI 31.96 kg/m  VS reviewed, nursing note reviewed,  Constitutional: well developed, well nourished, no distress HEENT: normocephalic CV: normal rate Pulm/chest wall: normal effort Breast Exam:  exam performed: right breast normal without mass, skin or nipple changes or axillary nodes, left breast normal without mass, skin or nipple  changes or axillary nodes Abdomen: soft Neuro: alert and oriented x 3 Skin: warm, dry Psych: affect normal Pelvic exam:  Performed: Cervix pink, visually closed, without lesion, scant white creamy discharge, vaginal walls and external genitalia normal Bimanual exam: Cervix 0/long/high, firm, anterior, neg CMT, uterus nontender, nonenlarged, adnexa without tenderness, enlargement, or mass       Assessment/Plan:  1. Encounter for screening mammogram for malignant neoplasm of breast  - MM DIGITAL SCREENING BILATERAL; Future  2. Screening for cervical cancer  - Cytology - PAP( [place])  3. Well woman exam with routine gynecological exam --Doing well, no gyn concerns, return in 1 year       Fatima Blank, CNM 4:32 PM

## 2022-07-24 LAB — CYTOLOGY - PAP
Comment: NEGATIVE
Diagnosis: NEGATIVE
Diagnosis: REACTIVE
High risk HPV: NEGATIVE

## 2022-09-09 ENCOUNTER — Ambulatory Visit
Admission: RE | Admit: 2022-09-09 | Discharge: 2022-09-09 | Disposition: A | Discharge status: Home / Self Care | Payer: BC Managed Care – PPO | Source: Ambulatory Visit | Attending: Advanced Practice Midwife | Admitting: Advanced Practice Midwife

## 2022-09-09 DIAGNOSIS — Z1231 Encounter for screening mammogram for malignant neoplasm of breast: Secondary | ICD-10-CM

## 2023-04-19 ENCOUNTER — Encounter (HOSPITAL_BASED_OUTPATIENT_CLINIC_OR_DEPARTMENT_OTHER): Payer: Self-pay | Admitting: Obstetrics & Gynecology

## 2023-04-19 DIAGNOSIS — F419 Anxiety disorder, unspecified: Secondary | ICD-10-CM

## 2023-04-19 DIAGNOSIS — R61 Generalized hyperhidrosis: Secondary | ICD-10-CM

## 2023-04-22 ENCOUNTER — Ambulatory Visit (HOSPITAL_COMMUNITY)
Admission: EM | Admit: 2023-04-22 | Discharge: 2023-04-22 | Disposition: A | Discharge status: Home / Self Care | Payer: BC Managed Care – PPO | Attending: Nurse Practitioner | Admitting: Nurse Practitioner

## 2023-04-22 ENCOUNTER — Encounter (HOSPITAL_BASED_OUTPATIENT_CLINIC_OR_DEPARTMENT_OTHER): Payer: Self-pay | Admitting: Obstetrics & Gynecology

## 2023-04-22 DIAGNOSIS — Z79899 Other long term (current) drug therapy: Secondary | ICD-10-CM | POA: Diagnosis not present

## 2023-04-22 DIAGNOSIS — F429 Obsessive-compulsive disorder, unspecified: Secondary | ICD-10-CM

## 2023-04-22 DIAGNOSIS — F419 Anxiety disorder, unspecified: Secondary | ICD-10-CM

## 2023-04-22 LAB — ESTRADIOL: Estradiol: 47.3 pg/mL

## 2023-04-22 LAB — FOLLICLE STIMULATING HORMONE: FSH: 7.9 m[IU]/mL

## 2023-04-22 MED ORDER — HYDROXYZINE HCL 25 MG PO TABS
25.0000 mg | ORAL_TABLET | Freq: Once | ORAL | Status: AC
  Administered 2023-04-22: 25 mg via ORAL
  Filled 2023-04-22: qty 1

## 2023-04-22 NOTE — Progress Notes (Deleted)
   04/22/23 1944  [facility] Triage Screening (Walk-ins at [facility] only)  Clinician description of patient physical appearance/behavior: Pt has an anxious expression and is physically restless, crossing/uncrossing legs, etc.  Pt has good eye contact and is oriented x4.  Pt is casually dressed.  She is not responding to internal stimuli nor does she evidence any delusional thought processes.  Pt reports poor appetite and sleep.  What Do You Feel Would Help You the Most Today? Treatment for Depression or other mood problem;Medication(s)  If access to [facility] was not available, would you have sought care in the Emergency Department? No  Determination of Need Routine (7 days)  Options For Referral Medication Management;BH Urgent Care

## 2023-04-22 NOTE — ED Provider Notes (Signed)
Behavioral Health Urgent Care Medical Screening Exam  Patient Name: Sandra Calhoun MRN: [id number] Date of Evaluation: 04/22/23 Chief Complaint:  "I'm here because I want new medication". Diagnosis:  Final diagnoses:  Encounter for medication management  Anxiety  Obsessive-compulsive disorder, unspecified type    History of Present illness: Sandra Calhoun is a 42 y.o. female. With psychiatric history of anxiety and obsessive compulsive disorder, who presented voluntarily as a walk-in to [facility] accompanied by her husband [name] [phone] with request for medication management and complaints of anxiety and worsening intrusive thoughts due to her OCD.   Patient was seen face to face by this provider and chart reviewed.   On evaluation, patient is alert, oriented x 4, and cooperative. Speech is clear, normal rate and coherent. Pt appears casual. Eye contact is good. Mood is anxious, affect is congruent with mood. Thought process is coherent and thought content is WDL. Pt denies SI/HI/AVH. There is no objective indication that the patient is responding to internal stimuli. No delusions elicited during this assessment.    Patient reports "I just came because my medication citalopram 40 mg for my OCD just has not seemed to be working.  I have OCD, and the intrusive thoughts just keep coming".  Patient reports "I started taking citalopram since 2018, but it was recently increased to 40 mg, 3 to 4 weeks ago, and at this point, I need something else because it's not working and the last couple of days, I just feel like I'm coming out of my skin, and the intrusive thoughts of what if I put something in his(husband) drink and poison him, something like that, but I've never harmed myself or anyone else".  Patient denies history of suicide attempts and denies history of inpatient psychiatric hospitalizations.  Patient reports also taking trazodone 50 mg and clonazepam, and reports both medications  are ineffective in managing her symptoms.   Patient reports "I'm not sure if it might be due to hormonal changes because I'm 41 and premenopausal, but I just want to be put on something different.  Patient reports she sees a psychiatrist at [facility], but is unsatisfied with the care, and expressed her frustration with getting an appointment to be seen the last couple of days. Patient expressed satisfaction with her therapist.  Patient reports poor sleep and appetite, and endorses she drinks 2-3 alcoholic beverages in a month and when she is on vacation. Patient reports she lives with her husband and denies the presence or access to firearms at the home.  Support, encouragement, and reassurance provided about ongoing stressors.  Patient is provided with opportunity for questions.  Discussed recommendation for administration of one-time dose of hydroxyzine 25 mg p.o. for anxiety prior to discharge.  Patient educated on this medication, indications for use, risks versus benefits and alcohol dependency on this class of medications.  Patient verbalized understanding and is in agreement. Discussed recommendation for discharge and follow-up with [facility] outpatient walk-in clinic in am. Patient is in agreement.   Flowsheet Row ED from 04/22/2023 in [facility]  C-SSRS RISK CATEGORY Low Risk       Psychiatric Specialty Exam  Presentation  General Appearance:Appropriate for Environment  Eye Contact:Good  Speech:Clear and Coherent  Speech Volume:Normal  Handedness:Right   Mood and Affect  Mood: Anxious  Affect: Congruent   Thought Process  Thought Processes: Coherent  Descriptions of Associations:Intact  Orientation:Full (Time, Place and Person)  Thought Content:WDL  Hallucinations:None  Ideas of Reference:None  Suicidal Thoughts:No  Homicidal Thoughts:No   Sensorium  Memory: Immediate  Good  Judgment: Intact  Insight: Good   Executive Functions  Concentration: Good  Attention Span: Good  Recall: Good  Fund of Knowledge: Good  Language: Good   Psychomotor Activity  Psychomotor Activity: Normal   Assets  Assets: Communication Skills; Desire for Improvement; Social Support; Resilience   Sleep  Sleep: Poor  Number of hours: No data recorded  Physical Exam: Physical Exam Constitutional:      General: She is not in acute distress.    Appearance: She is not diaphoretic.  HENT:     Head: Normocephalic.     Right Ear: External ear normal.     Left Ear: External ear normal.     Nose: No congestion.  Eyes:     General:        Right eye: No discharge.        Left eye: No discharge.  Cardiovascular:     Rate and Rhythm: Normal rate.  Pulmonary:     Effort: No respiratory distress.  Chest:     Chest wall: No tenderness.  Neurological:     Mental Status: She is alert and oriented to person, place, and time.  Psychiatric:        Attention and Perception: Attention and perception normal.        Mood and Affect: Mood is anxious.        Speech: Speech normal.        Behavior: Behavior is cooperative.        Thought Content: Thought content normal.        Cognition and Memory: Cognition and memory normal.    Review of Systems  Constitutional:  Negative for chills, diaphoresis and fever.  HENT:  Negative for congestion.   Eyes:  Negative for discharge.  Respiratory:  Negative for cough, shortness of breath and wheezing.   Cardiovascular:  Negative for chest pain and palpitations.  Gastrointestinal:  Negative for diarrhea, nausea and vomiting.  Neurological:  Negative for dizziness, seizures, loss of consciousness, weakness and headaches.  Psychiatric/Behavioral:  Negative for depression, substance abuse and suicidal ideas. The patient is nervous/anxious.    Blood pressure 132/76, pulse 67, temperature 98.5 F (36.9 C), temperature  source Oral, resp. rate 18, SpO2 100 %. There is no height or weight on file to calculate BMI.  Musculoskeletal: Strength & Muscle Tone: within normal limits Gait & Station: normal Patient leans: N/A   [facility] MSE Discharge Disposition for Follow up and Recommendations: Based on my evaluation the patient does not appear to have an emergency medical condition and can be discharged with resources and follow up care in outpatient services for Medication Management  Recommend administration of 1x dose of hydroxyzine 25 mg PO for anxiety. Indications for use, risks vs benefits of this medication briefly explained to the patient. Patient verbalized her understanding of teaching and is in agreement.   Recommend discharge and follow up with [facility] outpatient psychiatric services for medication management.  Patient does not meet inpatient psychiatric admission criteria or IVC criteria at this time. There is no evidence of imminent risk of harm to self or others.   Discussed methods to reduce the risk of self-injury or suicide attempts: Frequent conversations regarding unsafe thoughts. Remove all significant sharps. Remove all firearms. Remove all medications, including over-the-counter meds. Consider lockbox for medications and having a responsible person dispense medications until patient has strengthened coping skills.  Room checks for sharps or other harmful objects. Secure all chemical substances that can be ingested or inhaled.   Please refrain from using alcohol or illicit substances, as they can affect your mood and can cause depression, anxiety or other concerning symptoms.  Alcohol can increase the chance that a person will make reckless decisions, like attempting suicide, and can increase the lethality of a drug overdose.    Discussed crisis plan, calling 911 or returning to the ED if condition changes or worsens. Patient verbalized her understanding.  Patient discharged home and condition at  discharge is stable.   Mancel Bale, NP 04/22/2023, 10:45 PM

## 2023-04-22 NOTE — ED Notes (Signed)
Patient supplied with AVS. Discharge instructions and follow up instructions supplied and explained to patient. Patient verbalized understanding and denied further questions. Patient was escorted out of building without incident. 

## 2023-04-22 NOTE — Progress Notes (Signed)
   04/22/23 1913  [facility] Triage Screening (Walk-ins at [facility] only)  How Did You Hear About [place]? Family/Friend  What Is the Reason for Your Visit/Call Today? Patient arrives at [facility] with husband who brought her.  Patient says that she has OCD and has been taking Citalopram 40mg  once daily for the last few weeks.  She said that "it was working but it seems to not be working."  She has a psychiatrist Ottis Stain, NP at [facility]  She says she feels "like I am coming out of my skin."  She is having thoughts of hurting someone (husband).  She says she has thoughts of poisoning him.  She has no intention to do this however.  She says "I can't get rid of the intrusive thoughts."  Pt says she thinks she has been going through some perimenopause symptoms.  Pt denies SI but says she is starting to feel hopeless.  Pt denies any A/V hallucinations.  She says she may have ETOH about twice a month.  Patient says there are no guns in the home.  There are knives in the home but she says that she has no hx using knives to harm anyone.  No hx of self harm.  Pt reports not sleeping or eating.  Pt has a therapist named Graylin Shiver (online) at [employer], who suggested she come to [facility] today.  How Long Has This Been Causing You Problems? <Week  Have You Recently Had Any Thoughts About Hurting Yourself? No  Are You Planning to Commit Suicide/Harm Yourself At This time? No  Have you Recently Had Thoughts About Hurting Someone Karolee Ohs? Yes  How long ago did you have thoughts of harming others? Today  Are You Planning To Harm Someone At This Time? No  Are you currently experiencing any auditory, visual or other hallucinations? No  Have You Used Any Alcohol or Drugs in the Past 24 Hours? No  Do you have any current medical co-morbidities that require immediate attention? No  What Do You Feel Would Help You the Most Today? Treatment for Depression or other mood problem;Medication(s)  If access to [facility]  Urgent Care was not available, would you have sought care in the Emergency Department? No  Determination of Need Routine (7 days)  Options For Referral [facility];Medication Management

## 2023-04-22 NOTE — Discharge Instructions (Addendum)

## 2023-04-26 ENCOUNTER — Ambulatory Visit (INDEPENDENT_AMBULATORY_CARE_PROVIDER_SITE_OTHER): Payer: BC Managed Care – PPO | Admitting: Student in an Organized Health Care Education/Training Program

## 2023-04-26 ENCOUNTER — Telehealth (HOSPITAL_BASED_OUTPATIENT_CLINIC_OR_DEPARTMENT_OTHER): Payer: BC Managed Care – PPO | Admitting: Obstetrics & Gynecology

## 2023-04-26 ENCOUNTER — Encounter (HOSPITAL_BASED_OUTPATIENT_CLINIC_OR_DEPARTMENT_OTHER): Payer: Self-pay | Admitting: Obstetrics & Gynecology

## 2023-04-26 ENCOUNTER — Encounter (HOSPITAL_COMMUNITY): Payer: Self-pay | Admitting: Student in an Organized Health Care Education/Training Program

## 2023-04-26 VITALS — BP 136/81 | HR 75 | Ht 64.0 in | Wt 163.2 lb

## 2023-04-26 DIAGNOSIS — N951 Menopausal and female climacteric states: Secondary | ICD-10-CM

## 2023-04-26 DIAGNOSIS — F332 Major depressive disorder, recurrent severe without psychotic features: Secondary | ICD-10-CM | POA: Diagnosis not present

## 2023-04-26 DIAGNOSIS — N926 Irregular menstruation, unspecified: Secondary | ICD-10-CM

## 2023-04-26 DIAGNOSIS — F3281 Premenstrual dysphoric disorder: Secondary | ICD-10-CM

## 2023-04-26 DIAGNOSIS — F411 Generalized anxiety disorder: Secondary | ICD-10-CM

## 2023-04-26 DIAGNOSIS — F422 Mixed obsessional thoughts and acts: Secondary | ICD-10-CM

## 2023-04-26 MED ORDER — ESTRADIOL 0.1 MG/24HR TD PTWK
MEDICATED_PATCH | TRANSDERMAL | 0 refills | Status: DC
Start: 2023-04-26 — End: 2023-05-23

## 2023-04-26 MED ORDER — SERTRALINE HCL 50 MG PO TABS: 50.0000 mg | ORAL_TABLET | Freq: Every day | ORAL | 1 refills | Status: DC

## 2023-04-26 MED ORDER — TRAZODONE HCL 50 MG PO TABS: 50.0000 mg | ORAL_TABLET | Freq: Every day | ORAL | Status: DC

## 2023-04-26 MED ORDER — CITALOPRAM HYDROBROMIDE 10 MG PO TABS: 10.0000 mg | ORAL_TABLET | Freq: Every day | ORAL | Status: DC

## 2023-04-26 MED ORDER — DROSPIRENONE-ETHINYL ESTRADIOL 3-0.02 MG PO TABS
1.0000 | ORAL_TABLET | Freq: Every day | ORAL | 2 refills | Status: DC
Start: 2023-04-26 — End: 2023-07-20
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
## 2023-04-26 NOTE — Progress Notes (Addendum)
Virtual Visit via Video Note  I connected with Sandra Calhoun on 04/26/23 at  1:15 PM EDT by a video enabled telemedicine application and verified that I am speaking with the correct person using two identifiers.  Location: Patient: in her car (not driving) Provider: office   I discussed the limitations of evaluation and management by telemedicine and the availability of in person appointments. The patient expressed understanding and agreed to proceed.  History of Present Illness: 42 yo G0 for virtual visit with several complaints.  Has recent estradiol and FSH levels that were obtained due to concerns about perimenopause.  Her complaints are 1) she feels like she is shaking  2) having abdominal pain and 3) having night sweats and possibly some hot flashes.  She feels like anxiety makes the heat sensation more common.    Reports her anxiety is elevated right now.  She saw psychiatrist today.  She is going to wean off of her cymbalta and she will be started on zoloft.  She reports she did an over the counter menopausal test for menopause and it showed she is perimenopausal.    Cycles have been irregular.  For example, the last cycle was just spotty for three to four days and then flow did start but wasn't too heavy.  She is having a monthly cycle but the flow has changed.    She does not smoke.   Observations/Objective: WNWD NAD  Assessment and Plan: 1. Irregular bleeding - will start OCP to help.  Risks discussed with pt in detail including DUB, DVT/PE, headache, nausea, increased BP.  She knows to call with any concerns.  2. PMDD (premenstrual dysphoric disorder) - she is adjusting from celexa to zoloft as instructed by psychaitry  3. Perimenopausal - drospirenone-ethinyl estradiol (YAZ) 3-0.02 MG tablet; Take 1 tablet by mouth daily.  Dispense: 28 tablet; Refill: 2 - estradiol (CLIMARA) 0.1 mg/24hr patch; Place 1 patch during placebo week of OCPs.  Dispense: 4 patch; Refill: 0 -  recheck 2-3 months.  Message sent to scheduler for follow up appt.     Follow Up Instructions: I discussed the assessment and treatment plan with the patient. The patient was provided an opportunity to ask questions and all were answered. The patient agreed with the plan and demonstrated an understanding of the instructions.   The patient was advised to call back or seek an in-person evaluation if the symptoms worsen or if the condition fails to improve as anticipated.  I provided 36 minutes of non-face-to-face time during this encounter.   Jerene Bears, MD

## 2023-05-14 ENCOUNTER — Other Ambulatory Visit (HOSPITAL_COMMUNITY): Payer: Self-pay | Admitting: Student in an Organized Health Care Education/Training Program

## 2023-05-14 MED ORDER — TRAZODONE HCL 50 MG PO TABS: 50.0000 mg | ORAL_TABLET | Freq: Every day | ORAL | 0 refills | Status: DC

## 2023-05-14 NOTE — Progress Notes (Signed)
Patient called requesting refill of her Trazodone.  Her establishing appointment with Dr. Cyndie Chime is scheduled for 8/7.  This was sent in.   Sent: -Trazodone 50 mg QHS.  30 tablets with 0 refills.   Arna Snipe MD Resident

## 2023-05-23 ENCOUNTER — Other Ambulatory Visit (HOSPITAL_BASED_OUTPATIENT_CLINIC_OR_DEPARTMENT_OTHER): Payer: Self-pay | Admitting: Obstetrics & Gynecology

## 2023-05-23 DIAGNOSIS — N951 Menopausal and female climacteric states: Secondary | ICD-10-CM

## 2023-05-26 ENCOUNTER — Encounter (HOSPITAL_BASED_OUTPATIENT_CLINIC_OR_DEPARTMENT_OTHER): Payer: Self-pay | Admitting: Obstetrics & Gynecology

## 2023-06-03 ENCOUNTER — Ambulatory Visit (HOSPITAL_BASED_OUTPATIENT_CLINIC_OR_DEPARTMENT_OTHER): Payer: Self-pay | Admitting: Obstetrics & Gynecology

## 2023-06-08 DIAGNOSIS — Z79899 Other long term (current) drug therapy: Secondary | ICD-10-CM | POA: Insufficient documentation

## 2023-06-08 DIAGNOSIS — F3281 Premenstrual dysphoric disorder: Secondary | ICD-10-CM | POA: Insufficient documentation
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
## 2023-06-09 ENCOUNTER — Ambulatory Visit (HOSPITAL_BASED_OUTPATIENT_CLINIC_OR_DEPARTMENT_OTHER): Payer: BC Managed Care – PPO | Admitting: Student

## 2023-06-09 ENCOUNTER — Encounter (HOSPITAL_COMMUNITY): Payer: Self-pay | Admitting: Student

## 2023-06-09 VITALS — BP 128/80 | HR 76 | Ht 64.57 in | Wt 168.0 lb

## 2023-06-09 DIAGNOSIS — F3281 Premenstrual dysphoric disorder: Secondary | ICD-10-CM

## 2023-06-09 DIAGNOSIS — F411 Generalized anxiety disorder: Secondary | ICD-10-CM | POA: Diagnosis not present

## 2023-06-09 DIAGNOSIS — Z79899 Other long term (current) drug therapy: Secondary | ICD-10-CM

## 2023-06-09 DIAGNOSIS — F332 Major depressive disorder, recurrent severe without psychotic features: Secondary | ICD-10-CM

## 2023-06-09 DIAGNOSIS — F422 Mixed obsessional thoughts and acts: Secondary | ICD-10-CM

## 2023-06-09 MED ORDER — TRAZODONE HCL 50 MG PO TABS: 50.0000 mg | ORAL_TABLET | Freq: Every day | ORAL | 0 refills | Status: AC

## 2023-06-09 MED ORDER — SERTRALINE HCL 50 MG PO TABS
50.0000 mg | ORAL_TABLET | Freq: Every day | ORAL | 0 refills | Status: AC
Start: 2023-06-09 — End: 2023-07-09

## 2023-06-11 NOTE — Addendum Note (Signed)
Addended by: Everlena Cooper on: 06/11/2023 03:26 PM   Modules accepted: Level of Service

## 2023-06-15 ENCOUNTER — Encounter (HOSPITAL_BASED_OUTPATIENT_CLINIC_OR_DEPARTMENT_OTHER): Payer: Self-pay | Admitting: Obstetrics & Gynecology

## 2023-06-15 ENCOUNTER — Ambulatory Visit (INDEPENDENT_AMBULATORY_CARE_PROVIDER_SITE_OTHER): Payer: BC Managed Care – PPO | Admitting: Obstetrics & Gynecology

## 2023-06-15 VITALS — BP 112/60 | HR 72 | Ht 64.0 in | Wt 166.0 lb

## 2023-06-15 DIAGNOSIS — F3281 Premenstrual dysphoric disorder: Secondary | ICD-10-CM | POA: Diagnosis not present

## 2023-06-15 DIAGNOSIS — N951 Menopausal and female climacteric states: Secondary | ICD-10-CM

## 2023-06-15 NOTE — Progress Notes (Signed)
GYNECOLOGY  VISIT  CC:   follow up after starting Yaz  HPI: 42 y.o. G0P0000 Married White or Caucasian female here for follow up after starting Yaz and climara patch during placebo week.  Reports hot flashes and night sweats are improved.  She still has some hot flashes but these are still present.  She weaned off the celexa and is now on 50mg  zoloft.  She has a psychiatry appt is scheduled for the end of August.  She is going to plan to increase the zoloft at that time.  She did have a skin reaction.  She has a question about when to apply the patch.  She feels like there is only 3-4 days of placebo pills.  She is going to send me a picture of the pack of pills that she is currently taking so I can advise when it best to start the patch.    Has been diagnosed with sleep apnea and is current awaiting the CPAP because it is on backorder.  Past Medical History:  Diagnosis Date   Anxiety    GERD (gastroesophageal reflux disease)    OCD (obsessive compulsive disorder)     MEDS:   Current Outpatient Medications on File Prior to Visit  Medication Sig Dispense Refill   clonazePAM (KLONOPIN) 0.5 MG tablet Take 1 tablet (0.5 mg total) by mouth at bedtime. 30 tablet 0   drospirenone-ethinyl estradiol (YAZ) 3-0.02 MG tablet Take 1 tablet by mouth daily. 28 tablet 2   estradiol (CLIMARA - DOSED IN MG/24 HR) 0.1 mg/24hr patch PLACE 1 PATCH ONTO THE SKIN DURING PLACEBO WEEK OF BIRTH CONTROL 4 patch 0   fluticasone (FLONASE) 50 MCG/ACT nasal spray 1-2 sprays in each nostril daily as needed for allergies  5   levocetirizine (XYZAL) 5 MG tablet Take 5 mg by mouth every evening.     naproxen sodium (ANAPROX) 220 MG tablet Take 220 mg by mouth daily as needed (HA).     sertraline (ZOLOFT) 50 MG tablet Take 1 tablet (50 mg total) by mouth daily. Take a half tablet for 5 days then increase to 50 mg daily 30 tablet 0   traZODone (DESYREL) 50 MG tablet Take 1 tablet (50 mg total) by mouth at bedtime. 30 tablet  0   No current facility-administered medications on file prior to visit.    ALLERGIES: Patient has no known allergies.  SH:  married, non smoker  Review of Systems  Constitutional: Negative.   Genitourinary: Negative.     PHYSICAL EXAMINATION:    BP 112/60 (BP Location: Right Arm, Patient Position: Sitting, Cuff Size: Large)   Pulse 72   Ht 5\' 4"  (1.626 m) Comment: Reported  Wt 166 lb (75.3 kg)   BMI 28.49 kg/m     Physical Exam Constitutional:      Appearance: Normal appearance.  Neurological:     General: No focal deficit present.     Mental Status: She is alert.  Psychiatric:        Mood and Affect: Mood normal.     Assessment/Plan: 1. PMDD (premenstrual dysphoric disorder) - she is going to continue with the current OCP and climara patch during the placebo week - will need refills but she is going to send me the picture of her current pack of pills.  2. Perimenopausal  Total time with pt:  20 minutes

## 2023-06-18 ENCOUNTER — Other Ambulatory Visit (HOSPITAL_BASED_OUTPATIENT_CLINIC_OR_DEPARTMENT_OTHER): Payer: Self-pay | Admitting: Obstetrics & Gynecology

## 2023-06-18 DIAGNOSIS — N951 Menopausal and female climacteric states: Secondary | ICD-10-CM

## 2023-07-12 ENCOUNTER — Telehealth (HOSPITAL_COMMUNITY): Payer: Self-pay

## 2023-07-12 NOTE — Telephone Encounter (Signed)
Fax received from patients pharmacy for a refill on the Trazodone last filled on 8/7. Patient does not have a follow up appointment. Please review and advise, thank you

## 2023-07-20 ENCOUNTER — Encounter (HOSPITAL_BASED_OUTPATIENT_CLINIC_OR_DEPARTMENT_OTHER): Payer: Self-pay | Admitting: Obstetrics & Gynecology

## 2023-07-20 ENCOUNTER — Other Ambulatory Visit (HOSPITAL_BASED_OUTPATIENT_CLINIC_OR_DEPARTMENT_OTHER): Payer: Self-pay | Admitting: *Deleted

## 2023-07-20 DIAGNOSIS — N951 Menopausal and female climacteric states: Secondary | ICD-10-CM

## 2023-07-20 MED ORDER — DROSPIRENONE-ETHINYL ESTRADIOL 3-0.02 MG PO TABS
1.0000 | ORAL_TABLET | Freq: Every day | ORAL | 2 refills | Status: DC
Start: 2023-07-20 — End: 2023-10-08

## 2023-07-29 ENCOUNTER — Ambulatory Visit (HOSPITAL_BASED_OUTPATIENT_CLINIC_OR_DEPARTMENT_OTHER): Payer: BC Managed Care – PPO | Admitting: Obstetrics & Gynecology

## 2023-10-08 ENCOUNTER — Encounter (HOSPITAL_BASED_OUTPATIENT_CLINIC_OR_DEPARTMENT_OTHER): Payer: Self-pay | Admitting: Obstetrics & Gynecology

## 2023-10-08 ENCOUNTER — Other Ambulatory Visit (HOSPITAL_BASED_OUTPATIENT_CLINIC_OR_DEPARTMENT_OTHER): Payer: Self-pay | Admitting: Obstetrics & Gynecology

## 2023-10-08 DIAGNOSIS — N951 Menopausal and female climacteric states: Secondary | ICD-10-CM

## 2023-11-04 ENCOUNTER — Encounter (HOSPITAL_BASED_OUTPATIENT_CLINIC_OR_DEPARTMENT_OTHER): Payer: Self-pay | Admitting: Obstetrics & Gynecology

## 2023-11-04 ENCOUNTER — Ambulatory Visit (INDEPENDENT_AMBULATORY_CARE_PROVIDER_SITE_OTHER): Payer: 59 | Admitting: Obstetrics & Gynecology

## 2023-11-04 VITALS — BP 112/87 | HR 74 | Ht 64.0 in | Wt 168.2 lb

## 2023-11-04 DIAGNOSIS — Z01419 Encounter for gynecological examination (general) (routine) without abnormal findings: Secondary | ICD-10-CM | POA: Diagnosis not present

## 2023-11-04 DIAGNOSIS — F3281 Premenstrual dysphoric disorder: Secondary | ICD-10-CM

## 2023-11-04 DIAGNOSIS — N951 Menopausal and female climacteric states: Secondary | ICD-10-CM

## 2023-11-04 DIAGNOSIS — Z8049 Family history of malignant neoplasm of other genital organs: Secondary | ICD-10-CM | POA: Diagnosis not present

## 2023-11-04 DIAGNOSIS — Z23 Encounter for immunization: Secondary | ICD-10-CM

## 2023-11-04 MED ORDER — DROSPIRENONE-ETHINYL ESTRADIOL 3-0.02 MG PO TABS: 1.0000 | ORAL_TABLET | Freq: Every day | ORAL | 3 refills | Status: DC

## 2023-11-04 NOTE — Progress Notes (Signed)
 43 y.o. G0P0000 Married White or Caucasian female here for annual exam.  She is on OCPs and has been using climara  patch during the placebo weeks.  It has helped symptoms but that patch itself has been very annoying for her.  Discussed trying continuous active OCPs.  She is open to trying.   She is separated from spouse at this time.  Not SA.   Health Maintenance: Pap:  07/21/2022 Negative History of abnormal Pap:  no MMG:  09/09/2022 negative.  Pt aware this is due.   Colonoscopy:  guidelines reviewed.   Screening Labs: 03/2023 with Camie Mirza   reports that she has never smoked. She has never used smokeless tobacco. She reports current alcohol use. She reports that she does not use drugs.  Past Medical History:  Diagnosis Date   Anxiety    GERD (gastroesophageal reflux disease)    OCD (obsessive compulsive disorder)     Past Surgical History:  Procedure Laterality Date   WISDOM TOOTH EXTRACTION      Current Outpatient Medications  Medication Sig Dispense Refill   clonazePAM  (KLONOPIN ) 0.5 MG tablet Take 1 tablet (0.5 mg total) by mouth at bedtime. 30 tablet 0   drospirenone -ethinyl estradiol  (YAZ) 3-0.02 MG tablet TAKE 1 TABLET BY MOUTH DAILY 28 tablet 0   fluticasone (FLONASE) 50 MCG/ACT nasal spray 1-2 sprays in each nostril daily as needed for allergies  5   levocetirizine (XYZAL) 5 MG tablet Take 5 mg by mouth every evening.     traZODone  (DESYREL ) 50 MG tablet Take 1 tablet (50 mg total) by mouth at bedtime. 30 tablet 0   naproxen sodium (ANAPROX) 220 MG tablet Take 220 mg by mouth daily as needed (HA). (Patient not taking: Reported on 11/04/2023)     sertraline  (ZOLOFT ) 50 MG tablet Take 1 tablet (50 mg total) by mouth daily. Take a half tablet for 5 days then increase to 50 mg daily 30 tablet 0   No current facility-administered medications for this visit.    Family History  Problem Relation Age of Onset   Cancer Paternal Grandfather        lung   Diabetes Paternal  Grandmother    Heart attack Paternal Grandmother    Diabetes Maternal Grandmother    Heart disease Maternal Grandmother    Colon cancer Maternal Grandfather    Cancer Maternal Grandfather        lung and colon   Diabetes Father    Hypertension Father    Hyperlipidemia Father    Cancer Mother        ovarian or uterine    ROS: Constitutional: negative Genitourinary:negative  Exam:   BP 112/87 (BP Location: Left Arm, Patient Position: Sitting, Cuff Size: Normal)   Pulse 74   Ht 5' 4 (1.626 m)   Wt 168 lb 3.2 oz (76.3 kg)   LMP 10/02/2023 (Approximate)   BMI 28.87 kg/m   Height: 5' 4 (162.6 cm)  General appearance: alert, cooperative and appears stated age Head: Normocephalic, without obvious abnormality, atraumatic Neck: no adenopathy, supple, symmetrical, trachea midline and thyroid  normal to inspection and palpation Lungs: clear to auscultation bilaterally Breasts: normal appearance, no masses or tenderness Heart: regular rate and rhythm Abdomen: soft, non-tender; bowel sounds normal; no masses,  no organomegaly Extremities: extremities normal, atraumatic, no cyanosis or edema Skin: Skin color, texture, turgor normal. No rashes or lesions Lymph nodes: Cervical, supraclavicular, and axillary nodes normal. No abnormal inguinal nodes palpated Neurologic: Grossly normal   Pelvic:  External genitalia:  no lesions              Urethra:  normal appearing urethra with no masses, tenderness or lesions              Bartholins and Skenes: normal                 Vagina: normal appearing vagina with normal color and no discharge, no lesions              Cervix: no lesions              Pap taken: No. Bimanual Exam:  Uterus:  normal size, contour, position, consistency, mobility, non-tender              Adnexa: normal adnexa and no mass, fullness, tenderness               Rectovaginal: Confirms               Anus:  normal sphincter tone, no lesions  Chaperone, Bascom Kotyk, CMA,  was present for exam.  Assessment/Plan: 1. Well woman exam with routine gynecological exam (Primary) - Pap smear 2023.  Not indicated today. - Mammogram 09/2022.  Pt knows this is due. - Colonoscopy guidelines reviewed - lab work done with PCP, Camie Mirza - vaccines reviewed/updated  2. Family history of malignant neoplasm of endometrium  3. PMDD (premenstrual dysphoric disorder) - drospirenone -ethinyl estradiol  (YAZ) 3-0.02 MG tablet; Take 1 tablet by mouth daily. Takes continuous active pills.  Dispense: 112 tablet; Refill: 3

## 2023-11-05 ENCOUNTER — Other Ambulatory Visit (HOSPITAL_BASED_OUTPATIENT_CLINIC_OR_DEPARTMENT_OTHER): Payer: Self-pay | Admitting: Obstetrics & Gynecology

## 2023-11-05 DIAGNOSIS — N951 Menopausal and female climacteric states: Secondary | ICD-10-CM

## 2023-11-05 DIAGNOSIS — F3281 Premenstrual dysphoric disorder: Secondary | ICD-10-CM

## 2024-02-14 ENCOUNTER — Other Ambulatory Visit: Payer: Self-pay | Admitting: Physician Assistant

## 2024-02-14 DIAGNOSIS — Z1231 Encounter for screening mammogram for malignant neoplasm of breast: Secondary | ICD-10-CM

## 2024-02-15 ENCOUNTER — Ambulatory Visit
Admission: RE | Admit: 2024-02-15 | Discharge: 2024-02-15 | Discharge status: Home / Self Care | Source: Ambulatory Visit | Attending: Physician Assistant | Admitting: Physician Assistant

## 2024-02-15 DIAGNOSIS — Z1231 Encounter for screening mammogram for malignant neoplasm of breast: Secondary | ICD-10-CM

## 2024-02-18 ENCOUNTER — Other Ambulatory Visit: Payer: Self-pay | Admitting: Physician Assistant

## 2024-02-18 DIAGNOSIS — R928 Other abnormal and inconclusive findings on diagnostic imaging of breast: Secondary | ICD-10-CM

## 2024-03-02 ENCOUNTER — Ambulatory Visit
Admission: RE | Admit: 2024-03-02 | Discharge: 2024-03-02 | Disposition: A | Discharge status: Home / Self Care | Source: Ambulatory Visit | Attending: Physician Assistant | Admitting: Physician Assistant

## 2024-03-02 DIAGNOSIS — R928 Other abnormal and inconclusive findings on diagnostic imaging of breast: Secondary | ICD-10-CM

## 2024-03-03 ENCOUNTER — Other Ambulatory Visit: Payer: Self-pay | Admitting: Physician Assistant

## 2024-03-03 DIAGNOSIS — N6489 Other specified disorders of breast: Secondary | ICD-10-CM

## 2024-03-06 ENCOUNTER — Ambulatory Visit
Admission: RE | Admit: 2024-03-06 | Discharge: 2024-03-06 | Disposition: A | Discharge status: Home / Self Care | Source: Ambulatory Visit | Attending: Physician Assistant | Admitting: Physician Assistant

## 2024-03-06 DIAGNOSIS — N6489 Other specified disorders of breast: Secondary | ICD-10-CM

## 2024-03-06 HISTORY — PX: BREAST BIOPSY: SHX20

## 2024-03-07 LAB — SURGICAL PATHOLOGY

## 2024-10-08 ENCOUNTER — Other Ambulatory Visit (HOSPITAL_BASED_OUTPATIENT_CLINIC_OR_DEPARTMENT_OTHER): Payer: Self-pay | Admitting: Obstetrics & Gynecology

## 2024-10-08 DIAGNOSIS — F3281 Premenstrual dysphoric disorder: Secondary | ICD-10-CM

## 2024-11-20 ENCOUNTER — Ambulatory Visit (HOSPITAL_BASED_OUTPATIENT_CLINIC_OR_DEPARTMENT_OTHER): Payer: 59 | Admitting: Obstetrics & Gynecology

## 2025-01-24 ENCOUNTER — Ambulatory Visit (HOSPITAL_BASED_OUTPATIENT_CLINIC_OR_DEPARTMENT_OTHER): Admitting: Obstetrics & Gynecology
